# Patient Record
Sex: Female | Born: 1995
Health system: Southern US, Community
[De-identification: ages and names within clinical notes are randomized; demographics above are authoritative.]

## PROBLEM LIST (undated history)

## (undated) DIAGNOSIS — Z789 Other specified health status: Secondary | ICD-10-CM

## (undated) DIAGNOSIS — A749 Chlamydial infection, unspecified: Secondary | ICD-10-CM

## (undated) DIAGNOSIS — A549 Gonococcal infection, unspecified: Secondary | ICD-10-CM

## (undated) DIAGNOSIS — R109 Unspecified abdominal pain: Secondary | ICD-10-CM

## (undated) HISTORY — DX: Unspecified abdominal pain: R10.9

## (undated) HISTORY — PX: SKIN SURGERY: SHX2413

---
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
## 2017-02-26 ENCOUNTER — Encounter (HOSPITAL_COMMUNITY): Payer: Self-pay | Admitting: Emergency Medicine

## 2017-02-26 ENCOUNTER — Emergency Department (HOSPITAL_COMMUNITY)
Admission: EM | Admit: 2017-02-26 | Discharge: 2017-02-26 | Disposition: A | Discharge status: Home / Self Care | Payer: Medicaid Other | Attending: Emergency Medicine | Admitting: Emergency Medicine

## 2017-02-26 DIAGNOSIS — Z79899 Other long term (current) drug therapy: Secondary | ICD-10-CM | POA: Diagnosis not present

## 2017-02-26 DIAGNOSIS — J029 Acute pharyngitis, unspecified: Secondary | ICD-10-CM | POA: Diagnosis present

## 2017-02-26 DIAGNOSIS — Z7722 Contact with and (suspected) exposure to environmental tobacco smoke (acute) (chronic): Secondary | ICD-10-CM | POA: Insufficient documentation

## 2017-02-26 DIAGNOSIS — J050 Acute obstructive laryngitis [croup]: Secondary | ICD-10-CM | POA: Diagnosis not present

## 2017-02-26 DIAGNOSIS — Z91040 Latex allergy status: Secondary | ICD-10-CM | POA: Diagnosis not present

## 2017-02-26 DIAGNOSIS — J040 Acute laryngitis: Secondary | ICD-10-CM

## 2017-02-26 LAB — RAPID STREP SCREEN (MED CTR MEBANE ONLY): Streptococcus, Group A Screen (Direct): NEGATIVE

## 2017-02-26 MED ORDER — ACETAMINOPHEN 325 MG PO TABS
650.0000 mg | ORAL_TABLET | Freq: Once | ORAL | Status: AC | PRN
Start: 2017-02-26 — End: 2017-02-26
  Administered 2017-02-26: 650 mg via ORAL

## 2017-02-26 MED ORDER — ACETAMINOPHEN 325 MG PO TABS
ORAL_TABLET | ORAL | Status: AC
  Filled 2017-02-26: qty 2

## 2017-02-26 MED ORDER — LIDOCAINE VISCOUS 2 % MT SOLN: 15.0000 mL | OROMUCOSAL | 0 refills | Status: DC | PRN

## 2017-02-26 NOTE — Discharge Instructions (Signed)
Perform vocal rest and try not to talk over the next 2 days. Push fluids and drink plenty of water or Gatorade.  For pain control please take ibuprofen (also known as Motrin or Advil) 800mg  (this is normally 4 over the counter pills) 3 times a day  for 5 days. Take with food to minimize stomach irritation.  Please follow with your primary care doctor in the next 2 days for a check-up. They must obtain records for further management.   Do not hesitate to return to the Emergency Department for any new, worsening or concerning symptoms.

## 2017-02-26 NOTE — ED Triage Notes (Signed)
Pt presents with sore throat and left ear pain x 2 days; pt denies fever or trauma to ear that she is aware of; pt reports pain with swallowing and denies breathing trouble; pt brought bottle of cough suppressant DM and states she has 'been sipping on it all day"

## 2017-02-26 NOTE — ED Provider Notes (Signed)
[facility]-EMERGENCY DEPT Provider Note   CSN: [id number] Arrival date & time: 02/26/17  2138     History   Chief Complaint Chief Complaint  Patient presents with  . Sore Throat  . Otalgia    left     HPI   Blood pressure 133/66, pulse 98, temperature 99.4 F (37.4 C), temperature source Oral, resp. rate 20, height 5\' 6"  (1.676 m), weight 81.6 kg, SpO2 98 %.  Kristi Roth is a 21 y.o. female complaining of sore throat, hoarse voice, dry cough and any nose onset day before yesterday. She denies sick contacts, chest pain, shortness of breath, nausea or vomiting. Review of systems she endorses a decreased by mouth intake  Past Medical History:  Diagnosis Date  . Abdominal pain     Patient Active Problem List   Diagnosis Date Noted  . Abdominal pain     History reviewed. No pertinent surgical history.  OB History    No data available       Home Medications    Prior to Admission medications   Medication Sig Start Date End Date Taking? Authorizing Provider  cephALEXin (KEFLEX) 500 MG capsule Take 1 capsule (500 mg total) by mouth 3 (three) times daily. 08/21/15   Jennifer Piepenbrink, PA-C  ibuprofen (ADVIL,MOTRIN) 200 MG tablet Take 800 mg by mouth every 6 (six) hours as needed for pain.    Historical Provider, MD  lidocaine (XYLOCAINE) 2 % solution Use as directed 15 mLs in the mouth or throat every 3 (three) hours as needed. 02/26/17   Joni Reining Eliabeth Shoff, PA-C  norethindrone-ethinyl estradiol 1/35 (ORTHO-NOVUM, NORTREL,CYCLAFEM) tablet Take 1 tablet by mouth 2 (two) times daily. For 1 week then 1 time daily for 1 week. 08/10/15   Linna Hoff, MD  ondansetron (ZOFRAN ODT) 4 MG disintegrating tablet Take 1 tablet (4 mg total) by mouth every 8 (eight) hours as needed. 03/08/15   Viviano Simas, NP  phenazopyridine (PYRIDIUM) 200 MG tablet Take 1 tablet (200 mg total) by mouth 3 (three) times daily. 08/21/15   Jennifer Piepenbrink, PA-C  polyethylene glycol powder  (GLYCOLAX/MIRALAX) powder Take 17 g by mouth daily. 08/26/15   Earley Favor, NP    Family History History reviewed. No pertinent family history.  Social History Social History  Substance Use Topics  . Smoking status: Passive Smoke Exposure - Never Smoker  . Smokeless tobacco: Never Used  . Alcohol use No     Allergies   Latex   Review of Systems Review of Systems  10 systems reviewed and found to be negative, except as noted in the HPI.   Physical Exam Updated Vital Signs BP 133/66   Pulse 98   Temp 99.4 F (37.4 C) (Oral)   Resp 20   Ht 5\' 6"  (1.676 m)   Wt 81.6 kg   SpO2 98%   BMI 29.05 kg/m   Physical Exam  Constitutional: She appears well-developed and well-nourished.  Hoarse voice  HENT:  Head: Normocephalic.  Right Ear: External ear normal.  Left Ear: External ear normal.  Mouth/Throat: Oropharynx is clear and moist. No oropharyngeal exudate.  No drooling or stridor. Posterior pharynx mildly erythematous no significant tonsillar hypertrophy. No exudate. Soft palate rises symmetrically. No TTP or induration under tongue.   No tenderness to palpation of frontal or bilateral maxillary sinuses.  Mild mucosal edema in the nares with scant rhinorrhea.  Bilateral tympanic membranes with normal architecture and good light reflex.    Eyes: Conjunctivae and EOM are  normal. Pupils are equal, round, and reactive to light.  Neck: Normal range of motion. Neck supple.  Cardiovascular: Normal rate and regular rhythm.   Pulmonary/Chest: Effort normal and breath sounds normal. No stridor. No respiratory distress. She has no wheezes. She has no rales. She exhibits no tenderness.  Abdominal: Soft. There is no tenderness. There is no rebound and no guarding.  Nursing note and vitals reviewed.    ED Treatments / Results  Labs (all labs ordered are listed, but only abnormal results are displayed) Labs Reviewed  RAPID STREP SCREEN (NOT AT [facility])  CULTURE, GROUP A STREP  [facility])    EKG  EKG Interpretation None       Radiology No results found.  Procedures Procedures (including critical care time)  Medications Ordered in ED Medications  acetaminophen (TYLENOL) 325 MG tablet (not administered)  acetaminophen (TYLENOL) tablet 650 mg (650 mg Oral Given 02/26/17 2156)     Initial Impression / Assessment and Plan / ED Course  I have reviewed the triage vital signs and the nursing notes.  Pertinent labs & imaging results that were available during my care of the patient were reviewed by me and considered in my medical decision making (see chart for details).     Vitals:   02/26/17 2150  BP: 133/66  Pulse: 98  Resp: 20  Temp: 99.4 F (37.4 C)  TempSrc: Oral  SpO2: 98%  Weight: 81.6 kg  Height: 5\' 6"  (1.676 m)    Medications  acetaminophen (TYLENOL) 325 MG tablet (not administered)  acetaminophen (TYLENOL) tablet 650 mg (650 mg Oral Given 02/26/17 2156)    Kristi Roth is 21 y.o. female presenting with Sore throat, hoarse voice and URI symptoms. Vital signs reassuring, lung sounds clear to auscultation, rapid strep negative. Likely a viral lymphangitis. Patient advised vocal rest, push fluids.  Evaluation does not show pathology that would require ongoing emergent intervention or inpatient treatment. Pt is hemodynamically stable and mentating appropriately. Discussed findings and plan with patient/guardian, who agrees with care plan. All questions answered. Return precautions discussed and outpatient follow up given.      Final Clinical Impressions(s) / ED Diagnoses   Final diagnoses:  Laryngitis    New Prescriptions New Prescriptions   LIDOCAINE (XYLOCAINE) 2 % SOLUTION    Use as directed 15 mLs in the mouth or throat every 3 (three) hours as needed.     Wynetta Emeryicole Jhaden Pizzuto, PA-C 02/26/17 2245    Canary Brimhristopher J Tegeler, MD 02/26/17 [phone]

## 2017-03-01 LAB — CULTURE, GROUP A STREP (THRC)

## 2017-06-15 ENCOUNTER — Ambulatory Visit (HOSPITAL_COMMUNITY)
Admission: EM | Admit: 2017-06-15 | Discharge: 2017-06-15 | Disposition: A | Discharge status: Home / Self Care | Payer: Medicaid Other | Attending: Internal Medicine | Admitting: Internal Medicine

## 2017-06-15 ENCOUNTER — Encounter (HOSPITAL_COMMUNITY): Payer: Self-pay | Admitting: Emergency Medicine

## 2017-06-15 DIAGNOSIS — S90851A Superficial foreign body, right foot, initial encounter: Secondary | ICD-10-CM | POA: Diagnosis not present

## 2017-06-15 DIAGNOSIS — M79671 Pain in right foot: Secondary | ICD-10-CM | POA: Diagnosis not present

## 2017-06-15 DIAGNOSIS — M795 Residual foreign body in soft tissue: Secondary | ICD-10-CM | POA: Diagnosis not present

## 2017-06-15 DIAGNOSIS — Z23 Encounter for immunization: Secondary | ICD-10-CM | POA: Diagnosis not present

## 2017-06-15 MED ORDER — TETANUS-DIPHTH-ACELL PERTUSSIS 5-2.5-18.5 LF-MCG/0.5 IM SUSP
0.5000 mL | Freq: Once | INTRAMUSCULAR | Status: AC
  Administered 2017-06-15: 0.5 mL via INTRAMUSCULAR

## 2017-06-15 MED ORDER — LIDOCAINE-EPINEPHRINE (PF) 2 %-1:200000 IJ SOLN
INTRAMUSCULAR | Status: AC
  Filled 2017-06-15: qty 20

## 2017-06-15 MED ORDER — CEPHALEXIN 750 MG PO CAPS: 750.0000 mg | ORAL_CAPSULE | Freq: Two times a day (BID) | ORAL | 0 refills | Status: DC

## 2017-06-15 MED ORDER — TETANUS-DIPHTH-ACELL PERTUSSIS 5-2.5-18.5 LF-MCG/0.5 IM SUSP
INTRAMUSCULAR | Status: AC
  Filled 2017-06-15: qty 0.5

## 2017-06-15 NOTE — ED Provider Notes (Signed)
    06/15/2017 3:55 PM   DOB: Mar 15, 1996 / MRN: [id number]  SUBJECTIVE:  Kristi Roth is a 21 y.o. female presenting for "a piece of glass in her right foot." This started a few weeks ago and tells me the pain is getting worse.  This is about the left lateral plantar surface of the right foot.  She has not tried any medication. Denies any spots on her hands.  Tells me that she did step on a piece of glass from a broken window.   There is no immunization history for the selected administration types on file for this patient.  She is allergic to latex.   She  has a past medical history of Abdominal pain.    She  reports that she is a non-smoker but has been exposed to tobacco smoke. She has never used smokeless tobacco. She reports that she does not drink alcohol or use drugs. She  reports that she does not engage in sexual activity. The patient  has no past surgical history on file.  Her family history is not on file.  Review of Systems  Constitutional: Negative for chills and fever.  Respiratory: Negative for cough.   Genitourinary: Negative for dysuria.  Musculoskeletal: Negative for myalgias.  Skin: Positive for itching and rash.  Neurological: Negative for dizziness.    The problem list and medications were reviewed and updated by myself where necessary and exist elsewhere in the encounter.   OBJECTIVE:  BP 104/67 (BP Location: Left Arm)   Pulse 66   Temp 98.3 F (36.8 C) (Oral)   Resp 16   SpO2 99%     Physical Exam  Constitutional: She is active.  Cardiovascular: Normal rate.   Pulmonary/Chest: Effort normal. No tachypnea.  Musculoskeletal:       Feet:  Neurological: She is alert.  Skin: Skin is warm.   Risk and benefits discussed and verbal consent obtained. Anesthetic allergies reviewed. Patient anesthetized using 4 cc of 2% lidocaine with epi. A 1 cm incision was made using a number 15 blade and a 1 mm shard of glass was removed from the wound.  The was not  wound packed. The patient tolerated the procedure without difficulty.   A clean dressing was placed and wound care instructions were provided.    No results found for this or any previous visit (from the past 72 hour(s)).  No results found.  ASSESSMENT AND PLAN:  Foot pain, right: She did have a piece of glass in her foot.  This was easily removed.  TDAP here today.  Starting keflex given tenderness of skin.  OTC NSAID per AVS for pain. RTC here PRN.   Foreign body (FB) in soft tissue    The patient is advised to call or return to clinic if she does not see an improvement in symptoms, or to seek the care of the closest emergency department if she worsens with the above plan.   Kristi Roth, MHS, PA-C [facility] 06/15/2017 3:55 PM    Kristi Roth, Kristi Tupou L, PA-C 06/15/17 1556

## 2017-06-15 NOTE — Discharge Instructions (Signed)
Take Ibuprofen 600 mg every 6 hours as needed for pain. Complete course of antibiotics. Wash the wound with warm water and soap daily and apply a bandaid.  Come back if needed.

## 2017-06-15 NOTE — ED Triage Notes (Signed)
[facility] a complaint of a piece of glass possible stuck in her right foot x 3 weeks.

## 2017-07-19 ENCOUNTER — Emergency Department (HOSPITAL_COMMUNITY)
Admission: EM | Admit: 2017-07-19 | Discharge: 2017-07-20 | Disposition: A | Discharge status: Home / Self Care | Payer: Medicaid Other | Attending: Emergency Medicine | Admitting: Emergency Medicine

## 2017-07-19 ENCOUNTER — Emergency Department (HOSPITAL_COMMUNITY): Payer: Medicaid Other

## 2017-07-19 ENCOUNTER — Encounter (HOSPITAL_COMMUNITY): Payer: Self-pay | Admitting: *Deleted

## 2017-07-19 DIAGNOSIS — Z79899 Other long term (current) drug therapy: Secondary | ICD-10-CM | POA: Insufficient documentation

## 2017-07-19 DIAGNOSIS — Z91040 Latex allergy status: Secondary | ICD-10-CM | POA: Diagnosis not present

## 2017-07-19 DIAGNOSIS — R1011 Right upper quadrant pain: Secondary | ICD-10-CM

## 2017-07-19 DIAGNOSIS — Z7722 Contact with and (suspected) exposure to environmental tobacco smoke (acute) (chronic): Secondary | ICD-10-CM | POA: Insufficient documentation

## 2017-07-19 LAB — COMPREHENSIVE METABOLIC PANEL
ALT: 11 U/L — ABNORMAL LOW (ref 14–54)
AST: 16 U/L (ref 15–41)
Albumin: 3.7 g/dL (ref 3.5–5.0)
Alkaline Phosphatase: 67 U/L (ref 38–126)
Anion gap: 8 (ref 5–15)
BUN: 5 mg/dL — ABNORMAL LOW (ref 6–20)
CHLORIDE: 103 mmol/L (ref 101–111)
CO2: 23 mmol/L (ref 22–32)
Calcium: 8.8 mg/dL — ABNORMAL LOW (ref 8.9–10.3)
Creatinine, Ser: 0.7 mg/dL (ref 0.44–1.00)
Glucose, Bld: 114 mg/dL — ABNORMAL HIGH (ref 65–99)
POTASSIUM: 3.5 mmol/L (ref 3.5–5.1)
Sodium: 134 mmol/L — ABNORMAL LOW (ref 135–145)
TOTAL PROTEIN: 7 g/dL (ref 6.5–8.1)
Total Bilirubin: 0.4 mg/dL (ref 0.3–1.2)

## 2017-07-19 LAB — CBC
HCT: 35.8 % — ABNORMAL LOW (ref 36.0–46.0)
Hemoglobin: 11.7 g/dL — ABNORMAL LOW (ref 12.0–15.0)
MCH: 27.3 pg (ref 26.0–34.0)
MCHC: 32.7 g/dL (ref 30.0–36.0)
MCV: 83.6 fL (ref 78.0–100.0)
Platelets: 302 10*3/uL (ref 150–400)
RBC: 4.28 MIL/uL (ref 3.87–5.11)
RDW: 13.1 % (ref 11.5–15.5)
WBC: 9.8 10*3/uL (ref 4.0–10.5)

## 2017-07-19 LAB — LIPASE, BLOOD: LIPASE: 26 U/L (ref 11–51)

## 2017-07-19 LAB — I-STAT BETA HCG BLOOD, ED (MC, WL, AP ONLY)

## 2017-07-19 MED ORDER — ONDANSETRON HCL 4 MG/2ML IJ SOLN
4.0000 mg | Freq: Once | INTRAMUSCULAR | Status: AC
  Administered 2017-07-20: 4 mg via INTRAVENOUS
  Filled 2017-07-19: qty 2

## 2017-07-19 MED ORDER — MORPHINE SULFATE (PF) 4 MG/ML IV SOLN
4.0000 mg | Freq: Once | INTRAVENOUS | Status: AC
  Administered 2017-07-20: 4 mg via INTRAVENOUS
  Filled 2017-07-19: qty 1

## 2017-07-19 NOTE — ED Triage Notes (Signed)
The pt is c/o rt upper abd pain for 2 years intermittently.  The pain is worse for the past few days.  She has been told in the past she has gallstones  lmp now

## 2017-07-19 NOTE — ED Notes (Signed)
Patient transported to Ultrasound 

## 2017-07-20 MED ORDER — SUCRALFATE 1 GM/10ML PO SUSP: 1.0000 g | Freq: Three times a day (TID) | ORAL | 0 refills | Status: DC

## 2017-07-20 MED ORDER — HYDROCODONE-ACETAMINOPHEN 5-325 MG PO TABS: 1.0000 | ORAL_TABLET | Freq: Four times a day (QID) | ORAL | 0 refills | Status: DC | PRN

## 2017-07-20 MED ORDER — OMEPRAZOLE 20 MG PO CPDR: 20.0000 mg | DELAYED_RELEASE_CAPSULE | Freq: Every day | ORAL | 0 refills | Status: DC

## 2017-07-20 NOTE — ED Provider Notes (Signed)
[facility] Provider Note   CSN: [id number] Arrival date & time: 07/19/17  2057     History   Chief Complaint Chief Complaint  Patient presents with  . Abdominal Pain    HPI Kristi Roth is a 21 y.o. female.  Patient presents to the emergency department with chief complaint of right upper quadrant abdominal pain. She states that the pain has been intermittent for the past 2 years, but worsened over the past couple of days. She reports prior history of gallstones. She denies any fevers, chills, nausea, vomiting. She denies any association with eating or drinking. She denies any lower abdominal pain. She has not taken anything for her symptoms. There are no other associated symptoms or modifying factors.   The history is provided by the patient. No language interpreter was used.    Past Medical History:  Diagnosis Date  . Abdominal pain     Patient Active Problem List   Diagnosis Date Noted  . Abdominal pain     History reviewed. No pertinent surgical history.  OB History    No data available       Home Medications    Prior to Admission medications   Medication Sig Start Date End Date Taking? Authorizing Provider  cephALEXin (KEFLEX) 750 MG capsule Take 1 capsule (750 mg total) by mouth 2 (two) times daily. Take with food. 06/15/17   Ofilia Neaslark, Michael L, PA-C  HYDROcodone-acetaminophen (NORCO/VICODIN) 5-325 MG tablet Take 1 tablet by mouth every 6 (six) hours as needed. 07/20/17   Roxy HorsemanBrowning, Beulah Capobianco, PA-C  omeprazole (PRILOSEC) 20 MG capsule Take 1 capsule (20 mg total) by mouth daily. 07/20/17   Roxy HorsemanBrowning, Huntleigh Doolen, PA-C  sucralfate (CARAFATE) 1 GM/10ML suspension Take 10 mLs (1 g total) by mouth 4 (four) times daily -  with meals and at bedtime. 07/20/17   Roxy HorsemanBrowning, Lizbeth Feijoo, PA-C    Family History No family history on file.  Social History Social History  Substance Use Topics  . Smoking status: Passive Smoke Exposure - Never Smoker  . Smokeless tobacco: Never  Used  . Alcohol use No     Allergies   Latex   Review of Systems Review of Systems  All other systems reviewed and are negative.    Physical Exam Updated Vital Signs BP 121/62   Pulse 96   Temp 99.1 F (37.3 C) (Oral)   Resp 20   Ht 5\' 6"  (1.676 m)   Wt 82.1 kg (181 lb)   SpO2 100%   BMI 29.21 kg/m   Physical Exam  Constitutional: She is oriented to person, place, and time. She appears well-developed and well-nourished.  HENT:  Head: Normocephalic and atraumatic.  Eyes: Pupils are equal, round, and reactive to light. Conjunctivae and EOM are normal.  Neck: Normal range of motion. Neck supple.  Cardiovascular: Normal rate and regular rhythm.  Exam reveals no gallop and no friction rub.   No murmur heard. Pulmonary/Chest: Effort normal and breath sounds normal. No respiratory distress. She has no wheezes. She has no rales. She exhibits no tenderness.  Abdominal: Soft. Bowel sounds are normal. She exhibits no distension and no mass. There is tenderness. There is no rebound and no guarding.  Right upper quadrant tenderness palpation  Musculoskeletal: Normal range of motion. She exhibits no edema or tenderness.  Neurological: She is alert and oriented to person, place, and time.  Skin: Skin is warm and dry.  Psychiatric: She has a normal mood and affect. Her behavior is normal. Judgment  and thought content normal.  Nursing note and vitals reviewed.    ED Treatments / Results  Labs (all labs ordered are listed, but only abnormal results are displayed) Labs Reviewed  COMPREHENSIVE METABOLIC PANEL - Abnormal; Notable for the following:       Result Value   Sodium 134 (*)    Glucose, Bld 114 (*)    BUN 5 (*)    Calcium 8.8 (*)    ALT 11 (*)    All other components within normal limits  CBC - Abnormal; Notable for the following:    Hemoglobin 11.7 (*)    HCT 35.8 (*)    All other components within normal limits  LIPASE, BLOOD  I-STAT BETA HCG BLOOD, ED ([facility], [facility],  [facility] ONLY)    EKG  EKG Interpretation None       Radiology US Abdomen Limited  Result Date: 07/20/2017 CLINICAL DATA:  Right upper quadrant pain EXAM: ULTRASOUND ABDOMEN LIMITED RIGHT UPPER QUADRANT COMPARISON:  Abdominal ultrasound 04/26/2013 FINDINGS: Gallbladder: No gallstones or wall thickening visualized. No sonographic Murphy sign noted by sonographer. Common bile duct: Diameter: 2.2 mm Liver: No focal lesion identified. Within normal limits in parenchymal echogenicity. IMPRESSION: Normal right upper quadrant ultrasound. Electronically Signed   By: Deatra Robinson M.D.   On: 07/20/2017 00:10    Procedures Procedures (including critical care time)  Medications Ordered in ED Medications  morphine 4 MG/ML injection 4 mg (4 mg Intravenous Given 07/20/17 0016)  ondansetron (ZOFRAN) injection 4 mg (4 mg Intravenous Given 07/20/17 0016)     Initial Impression / Assessment and Plan / ED Course  I have reviewed the triage vital signs and the nursing notes.  Pertinent labs & imaging results that were available during my care of the patient were reviewed by me and considered in my medical decision making (see chart for details).     Patient with right upper quadrant abdominal pain 2 days. Symptoms have been intermittent for 2 years. Known history of gallstones. Patient has no gallstones on right upper quadrant ultrasound today. No evidence of cholecystitis. Laboratory workup is reassuring. Normal LFTs, normal lipase. Patient is afebrile. Vital signs are stable. Recommend return precautions and follow-up with outpatient PCP and possibly gastroenterology. Will also try Carafate and omeprazole for possible ulcer.  Final Clinical Impressions(s) / ED Diagnoses   Final diagnoses:  Right upper quadrant abdominal pain    New Prescriptions New Prescriptions   HYDROCODONE-ACETAMINOPHEN (NORCO/VICODIN) 5-325 MG TABLET    Take 1 tablet by mouth every 6 (six) hours as needed.   OMEPRAZOLE  (PRILOSEC) 20 MG CAPSULE    Take 1 capsule (20 mg total) by mouth daily.   SUCRALFATE (CARAFATE) 1 GM/10ML SUSPENSION    Take 10 mLs (1 g total) by mouth 4 (four) times daily -  with meals and at bedtime.     Roxy Horseman, PA-C 07/20/17 1610    Blane Ohara, MD 07/20/17 [phone]

## 2017-10-25 ENCOUNTER — Ambulatory Visit (HOSPITAL_COMMUNITY)
Admission: EM | Admit: 2017-10-25 | Discharge: 2017-10-25 | Disposition: A | Discharge status: Home / Self Care | Payer: Medicaid Other | Attending: Family Medicine | Admitting: Family Medicine

## 2017-10-25 ENCOUNTER — Encounter (HOSPITAL_COMMUNITY): Payer: Self-pay | Admitting: Family Medicine

## 2017-10-25 DIAGNOSIS — W503XXA Accidental bite by another person, initial encounter: Secondary | ICD-10-CM

## 2017-10-25 DIAGNOSIS — Z23 Encounter for immunization: Secondary | ICD-10-CM | POA: Diagnosis not present

## 2017-10-25 DIAGNOSIS — S0081XA Abrasion of other part of head, initial encounter: Secondary | ICD-10-CM | POA: Diagnosis not present

## 2017-10-25 MED ORDER — MUPIROCIN 2 % EX OINT: 1.0000 "application " | TOPICAL_OINTMENT | Freq: Three times a day (TID) | CUTANEOUS | 1 refills | Status: DC

## 2017-10-25 MED ORDER — TETANUS-DIPHTH-ACELL PERTUSSIS 5-2.5-18.5 LF-MCG/0.5 IM SUSP
INTRAMUSCULAR | Status: AC
  Filled 2017-10-25: qty 0.5

## 2017-10-25 MED ORDER — TETANUS-DIPHTH-ACELL PERTUSSIS 5-2.5-18.5 LF-MCG/0.5 IM SUSP
0.5000 mL | Freq: Once | INTRAMUSCULAR | Status: AC
  Administered 2017-10-25: 0.5 mL via INTRAMUSCULAR
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
## 2017-10-25 NOTE — Discharge Instructions (Signed)
Gently wash the left cheek with soap and water three times a day and then apply the antibiotic cream thinly to the area.

## 2018-02-27 DIAGNOSIS — A549 Gonococcal infection, unspecified: Secondary | ICD-10-CM

## 2018-02-27 HISTORY — DX: Gonococcal infection, unspecified: A54.9

## 2018-03-04 ENCOUNTER — Other Ambulatory Visit: Payer: Self-pay

## 2018-03-04 ENCOUNTER — Emergency Department (HOSPITAL_COMMUNITY)
Admission: EM | Admit: 2018-03-04 | Discharge: 2018-03-04 | Disposition: A | Discharge status: Home / Self Care | Payer: Medicaid Other | Attending: Emergency Medicine | Admitting: Emergency Medicine

## 2018-03-04 ENCOUNTER — Encounter (HOSPITAL_COMMUNITY): Payer: Self-pay

## 2018-03-04 DIAGNOSIS — Z7722 Contact with and (suspected) exposure to environmental tobacco smoke (acute) (chronic): Secondary | ICD-10-CM | POA: Diagnosis not present

## 2018-03-04 DIAGNOSIS — N730 Acute parametritis and pelvic cellulitis: Secondary | ICD-10-CM

## 2018-03-04 DIAGNOSIS — R102 Pelvic and perineal pain: Secondary | ICD-10-CM | POA: Diagnosis present

## 2018-03-04 DIAGNOSIS — N739 Female pelvic inflammatory disease, unspecified: Secondary | ICD-10-CM | POA: Insufficient documentation

## 2018-03-04 LAB — COMPREHENSIVE METABOLIC PANEL
ALT: 13 U/L — ABNORMAL LOW (ref 14–54)
ANION GAP: 11 (ref 5–15)
AST: 19 U/L (ref 15–41)
Albumin: 4.1 g/dL (ref 3.5–5.0)
Alkaline Phosphatase: 65 U/L (ref 38–126)
BUN: <5 mg/dL — ABNORMAL LOW (ref 6–20)
CHLORIDE: 106 mmol/L (ref 101–111)
CO2: 24 mmol/L (ref 22–32)
Calcium: 9.3 mg/dL (ref 8.9–10.3)
Creatinine, Ser: 0.87 mg/dL (ref 0.44–1.00)
Glucose, Bld: 98 mg/dL (ref 65–99)
Potassium: 3.5 mmol/L (ref 3.5–5.1)
SODIUM: 141 mmol/L (ref 135–145)
Total Bilirubin: 1 mg/dL (ref 0.3–1.2)
Total Protein: 7.3 g/dL (ref 6.5–8.1)

## 2018-03-04 LAB — URINALYSIS, ROUTINE W REFLEX MICROSCOPIC
BILIRUBIN URINE: NEGATIVE
GLUCOSE, UA: NEGATIVE mg/dL
KETONES UR: 20 mg/dL — AB
Nitrite: NEGATIVE
PH: 6 (ref 5.0–8.0)
PROTEIN: 30 mg/dL — AB
Specific Gravity, Urine: 1.028 (ref 1.005–1.030)

## 2018-03-04 LAB — I-STAT BETA HCG BLOOD, ED (MC, WL, AP ONLY): I-stat hCG, quantitative: <5 m[IU]/mL (ref <5)

## 2018-03-04 LAB — CBC
HCT: 39.9 % (ref 36.0–46.0)
HEMOGLOBIN: 13.3 g/dL (ref 12.0–15.0)
MCH: 29.2 pg (ref 26.0–34.0)
MCHC: 33.3 g/dL (ref 30.0–36.0)
MCV: 87.5 fL (ref 78.0–100.0)
Platelets: 319 10*3/uL (ref 150–400)
RBC: 4.56 MIL/uL (ref 3.87–5.11)
RDW: 13.4 % (ref 11.5–15.5)
WBC: 8.2 10*3/uL (ref 4.0–10.5)

## 2018-03-04 LAB — WET PREP, GENITAL
Clue Cells Wet Prep HPF POC: NONE SEEN
SPERM: NONE SEEN
TRICH WET PREP: NONE SEEN
YEAST WET PREP: NONE SEEN

## 2018-03-04 LAB — LIPASE, BLOOD: LIPASE: 27 U/L (ref 11–51)

## 2018-03-04 MED ORDER — CEFTRIAXONE SODIUM 250 MG IJ SOLR
250.0000 mg | Freq: Once | INTRAMUSCULAR | Status: AC
  Administered 2018-03-04: 250 mg via INTRAMUSCULAR
  Filled 2018-03-04: qty 250

## 2018-03-04 MED ORDER — DOXYCYCLINE HYCLATE 100 MG PO CAPS: 100.0000 mg | ORAL_CAPSULE | Freq: Two times a day (BID) | ORAL | 0 refills | Status: AC

## 2018-03-04 MED ORDER — ONDANSETRON 4 MG PO TBDP
4.0000 mg | ORAL_TABLET | Freq: Once | ORAL | Status: AC
  Administered 2018-03-04: 4 mg via ORAL
  Filled 2018-03-04: qty 1

## 2018-03-04 MED ORDER — KETOROLAC TROMETHAMINE 60 MG/2ML IM SOLN
30.0000 mg | Freq: Once | INTRAMUSCULAR | Status: AC
  Administered 2018-03-04: 30 mg via INTRAMUSCULAR
  Filled 2018-03-04: qty 2

## 2018-03-04 MED ORDER — DOXYCYCLINE HYCLATE 100 MG PO TABS
100.0000 mg | ORAL_TABLET | Freq: Once | ORAL | Status: AC
  Administered 2018-03-04: 100 mg via ORAL
  Filled 2018-03-04: qty 1

## 2018-03-04 NOTE — ED Notes (Signed)
D/c reviewed with patient. No further questions at this time 

## 2018-03-04 NOTE — ED Notes (Signed)
Patient reports that she has had nausea, vomiting and diarrhea X 2 days.

## 2018-03-04 NOTE — ED Provider Notes (Signed)
[facility] EMERGENCY DEPARTMENT Provider Note  CSN: [id number] Arrival date & time: 03/04/18 7829  Chief Complaint(s) No chief complaint on file.  HPI Kristi Roth is a 22 y.o. female with no prior past medical history presents to the emergency department with several days of constant aching pelvic pain that fluctuates and exacerbated with movement and palpation of the lower suprapubic region.  Patient reports last menstrual period was approximately 6 days ago and is now spotting.  Denies any vaginal discharge.  Denies any dysuria or flank pain.  Endorses nausea without emesis.  No diarrhea.  No fevers or chills.  Patient is sexually active but denies any prior sexually transmitted disease. STIs.   HPI  Past Medical History Past Medical History:  Diagnosis Date  . Abdominal pain    Patient Active Problem List   Diagnosis Date Noted  . Abdominal pain    Home Medication(s) Prior to Admission medications   Medication Sig Start Date End Date Taking? Authorizing Provider  Acetaminophen-Caff-Pyrilamine (MIDOL COMPLETE) 500-60-15 MG TABS Take 1 tablet by mouth daily as needed (pain).   Yes [provider]  cephALEXin (KEFLEX) 750 MG capsule Take 1 capsule (750 mg total) by mouth 2 (two) times daily. Take with food. Patient not taking: Reported on 03/04/2018 06/15/17   Ofilia Neas, PA-C  doxycycline (VIBRAMYCIN) 100 MG capsule Take 1 capsule (100 mg total) by mouth 2 (two) times daily for 14 days. 03/04/18 03/18/18  Nira Conn, MD  HYDROcodone-acetaminophen (NORCO/VICODIN) 5-325 MG tablet Take 1 tablet by mouth every 6 (six) hours as needed. Patient not taking: Reported on 03/04/2018 07/20/17   Roxy Horseman, PA-C  mupirocin ointment (BACTROBAN) 2 % Apply 1 application topically 3 (three) times daily. Patient not taking: Reported on 03/04/2018 10/25/17   Elvina Sidle, MD  omeprazole (PRILOSEC) 20 MG capsule Take 1 capsule (20 mg total) by mouth  daily. Patient not taking: Reported on 03/04/2018 07/20/17   Roxy Horseman, PA-C  sucralfate (CARAFATE) 1 GM/10ML suspension Take 10 mLs (1 g total) by mouth 4 (four) times daily -  with meals and at bedtime. Patient not taking: Reported on 03/04/2018 07/20/17   Roxy Horseman, PA-C                                                                                                                                    Past Surgical History History reviewed. No pertinent surgical history. Family History No family history on file.  Social History Social History   Tobacco Use  . Smoking status: Passive Smoke Exposure - Never Smoker  . Smokeless tobacco: Never Used  Substance Use Topics  . Alcohol use: No  . Drug use: No   Allergies Latex  Review of Systems Review of Systems All other systems are reviewed and are negative for acute change except as noted in the HPI  Physical Exam Vital Signs  I have reviewed  the triage vital signs BP 104/64   Pulse (!) 120   Temp 98.3 F (36.8 C) (Oral)   Resp 18   SpO2 100%   Physical Exam  Constitutional: She is oriented to person, place, and time. She appears well-developed and well-nourished. No distress.  HENT:  Head: Normocephalic and atraumatic.  Right Ear: External ear normal.  Left Ear: External ear normal.  Nose: Nose normal.  Eyes: Conjunctivae and EOM are normal. No scleral icterus.  Neck: Normal range of motion and phonation normal.  Cardiovascular: Normal rate and regular rhythm.  Pulmonary/Chest: Effort normal. No stridor. No respiratory distress.  Abdominal: She exhibits no distension. There is tenderness in the suprapubic area and left lower quadrant. There is no rigidity, no rebound, no guarding and no CVA tenderness.  Genitourinary: Pelvic exam was performed with patient supine. Uterus is tender. Cervix exhibits motion tenderness, discharge and friability. Left adnexum displays tenderness. Vaginal discharge found.    Genitourinary Comments: Chaperone present during pelvic exam.  Musculoskeletal: Normal range of motion. She exhibits no edema.  Neurological: She is alert and oriented to person, place, and time.  Skin: She is not diaphoretic.  Psychiatric: She has a normal mood and affect. Her behavior is normal.  Vitals reviewed.   ED Results and Treatments Labs (all labs ordered are listed, but only abnormal results are displayed) Labs Reviewed  WET PREP, GENITAL - Abnormal; Notable for the following components:      Result Value   WBC, Wet Prep HPF POC MANY (*)    All other components within normal limits  COMPREHENSIVE METABOLIC PANEL - Abnormal; Notable for the following components:   BUN <5 (*)    ALT 13 (*)    All other components within normal limits  URINALYSIS, ROUTINE W REFLEX MICROSCOPIC - Abnormal; Notable for the following components:   APPearance HAZY (*)    Hgb urine dipstick MODERATE (*)    Ketones, ur 20 (*)    Protein, ur 30 (*)    Leukocytes, UA LARGE (*)    Bacteria, UA FEW (*)    Squamous Epithelial / LPF 0-5 (*)    All other components within normal limits  LIPASE, BLOOD  CBC  I-STAT BETA HCG BLOOD, ED ([facility], [facility], [facility] ONLY)  GC/CHLAMYDIA PROBE AMP ([place]) NOT AT [facility]                                                                                                                         EKG  EKG Interpretation  Date/Time:    Ventricular Rate:    PR Interval:    QRS Duration:   QT Interval:    QTC Calculation:   R Axis:     Text Interpretation:        Radiology No results found. Pertinent labs & imaging results that were available during my care of the patient were reviewed by me and considered in my medical decision making (see chart for details).  Medications  Ordered in ED Medications  ondansetron (ZOFRAN-ODT) disintegrating tablet 4 mg (4 mg Oral Given 03/04/18 1033)  ketorolac (TORADOL) injection 30 mg (30 mg Intramuscular Given 03/04/18 1311)   cefTRIAXone (ROCEPHIN) injection 250 mg (250 mg Intramuscular Given 03/04/18 1311)  doxycycline (VIBRA-TABS) tablet 100 mg (100 mg Oral Given 03/04/18 1311)                                                                                                                                    Procedures Procedures  (including critical care time)  Medical Decision Making / ED Course I have reviewed the nursing notes for this encounter and the patient's prior records (if available in EHR or on provided paperwork).  Clinical Course as of Mar 04 2110  Wed Mar 04, 2018  1242 HCG negative. Pelvic exam concerning for PID.  Will treat empirically with IM Rocephin and doxycycline.  [PC]  1242 All the labs grossly reassuring without leukocytosis, significant electrolyte derangements, renal insufficiency, biliary obstruction or evidence of pancreatitis.  UA with hematuria but not suspicious for urinary tract infection. Low suspicion for TOA or torsion.  [PC]    Clinical Course User Index [PC] Alijah Hyde, Amadeo GarnetPedro Eduardo, MD     Final Clinical Impression(s) / ED Diagnoses Final diagnoses:  PID (acute pelvic inflammatory disease)   Disposition: Discharge  Condition: Good  I have discussed the results, Dx and Tx plan with the patient who expressed understanding and agree(s) with the plan. Discharge instructions discussed at great length. The patient was given strict return precautions who verbalized understanding of the instructions. No further questions at time of discharge.    ED Discharge Orders        Ordered    doxycycline (VIBRAMYCIN) 100 MG capsule  2 times daily     03/04/18 1411       Follow Up: Inc, [facility] [address] [place] [address] [phone]  Schedule an appointment as soon as possible for a visit in 2 weeks To ensure adequate treatment      This chart was dictated using voice recognition software.  Despite best efforts to proofread,   errors can occur which can change the documentation meaning.   Nira Connardama, Twylia Oka Eduardo, MD 03/04/18 2111

## 2018-03-04 NOTE — ED Triage Notes (Signed)
patient complains of abdominal pain with nausea and lower back pain and generalized headache x 2 days. Denies vomiting, denies dysuria

## 2018-03-05 LAB — GC/CHLAMYDIA PROBE AMP (~~LOC~~) NOT AT ARMC
CHLAMYDIA, DNA PROBE: NEGATIVE
NEISSERIA GONORRHEA: POSITIVE — AB

## 2018-06-21 ENCOUNTER — Encounter (HOSPITAL_COMMUNITY): Payer: Self-pay | Admitting: Emergency Medicine

## 2018-06-21 ENCOUNTER — Other Ambulatory Visit: Payer: Self-pay

## 2018-06-21 ENCOUNTER — Ambulatory Visit (HOSPITAL_COMMUNITY)
Admission: EM | Admit: 2018-06-21 | Discharge: 2018-06-21 | Disposition: A | Discharge status: Home / Self Care | Payer: Medicaid Other | Attending: Family Medicine | Admitting: Family Medicine

## 2018-06-21 DIAGNOSIS — M542 Cervicalgia: Secondary | ICD-10-CM

## 2018-06-21 DIAGNOSIS — R0789 Other chest pain: Secondary | ICD-10-CM

## 2018-06-21 DIAGNOSIS — M5489 Other dorsalgia: Secondary | ICD-10-CM | POA: Diagnosis not present

## 2018-06-21 DIAGNOSIS — M791 Myalgia, unspecified site: Secondary | ICD-10-CM

## 2018-06-21 MED ORDER — CYCLOBENZAPRINE HCL 10 MG PO TABS
10.0000 mg | ORAL_TABLET | Freq: Two times a day (BID) | ORAL | 0 refills | Status: AC | PRN
Start: 2018-06-21 — End: 2018-07-01
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
## 2019-01-26 ENCOUNTER — Emergency Department (HOSPITAL_COMMUNITY)
Admission: EM | Admit: 2019-01-26 | Discharge: 2019-01-26 | Discharge status: Against Medical Advice | Payer: Medicaid Other

## 2019-03-02 ENCOUNTER — Ambulatory Visit
Admission: EM | Admit: 2019-03-02 | Discharge: 2019-03-02 | Disposition: A | Discharge status: Home / Self Care | Payer: Medicaid Other | Attending: Physician Assistant | Admitting: Physician Assistant

## 2019-03-02 DIAGNOSIS — R197 Diarrhea, unspecified: Secondary | ICD-10-CM

## 2019-03-02 DIAGNOSIS — N898 Other specified noninflammatory disorders of vagina: Secondary | ICD-10-CM | POA: Diagnosis present

## 2019-03-02 DIAGNOSIS — R112 Nausea with vomiting, unspecified: Secondary | ICD-10-CM | POA: Diagnosis not present

## 2019-03-02 MED ORDER — ONDANSETRON 4 MG PO TBDP: 4.0000 mg | ORAL_TABLET | Freq: Three times a day (TID) | ORAL | 0 refills | Status: DC | PRN

## 2019-03-02 NOTE — ED Provider Notes (Signed)
[name]    CSN: [id number] Arrival date & time: 03/02/19  1646     History   Chief Complaint Chief Complaint  Patient presents with  . Nausea    HPI Kristi Roth is a 23 y.o. female.   23 year old female comes in for 4-day history of nausea, vomiting, diarrhea.  States had 4 episodes of nonbilious nonbloody vomit, last episode of vomiting 2 days ago.  Has had 5 episodes of diarrhea, last episode last night.  States started noticing vaginal discharge with odor this morning and came in for evaluation.  She tried to eat eggs, sausage this morning, and had low abdominal pain after eating, but has since resolved.  She has had mild nausea without vomiting.  Denies fever, chills, night sweats.  Denies URI symptoms such as cough, congestion, sore throat.  Denies urinary symptoms such as frequency, dysuria, hematuria.  She is sexually active with one female partner, no condom use.  LMP 02/12/2019.     Past Medical History:  Diagnosis Date  . Abdominal pain     Patient Active Problem List   Diagnosis Date Noted  . Abdominal pain     History reviewed. No pertinent surgical history.  OB History   No obstetric history on file.      Home Medications    Prior to Admission medications   Medication Sig Start Date End Date Taking? Authorizing Provider  ondansetron (ZOFRAN ODT) 4 MG disintegrating tablet Take 1 tablet (4 mg total) by mouth every 8 (eight) hours as needed for nausea or vomiting. 03/02/19   Belinda Fisher, PA-C    Family History No family history on file.  Social History Social History   Tobacco Use  . Smoking status: Passive Smoke Exposure - Never Smoker  . Smokeless tobacco: Never Used  Substance Use Topics  . Alcohol use: No  . Drug use: No     Allergies   Latex   Review of Systems Review of Systems  Reason unable to perform ROS: See HPI as above.     Physical Exam Triage Vital Signs ED Triage Vitals  Enc Vitals Group     BP 03/02/19  1701 (!) 156/53     Pulse Rate 03/02/19 1701 90     Resp --      Temp 03/02/19 1701 98.3 F (36.8 C)     Temp Source 03/02/19 1701 Oral     SpO2 03/02/19 1701 98 %     Weight --      Height --      Head Circumference --      Peak Flow --      Pain Score 03/02/19 1702 0     Pain Loc --      Pain Edu? --      Excl. in GC? --    No data found.  Updated Vital Signs BP (!) 156/53 (BP Location: Left Arm)   Pulse 90   Temp 98.3 F (36.8 C) (Oral)   LMP 02/12/2019   SpO2 98%   Physical Exam Constitutional:      General: She is not in acute distress.    Appearance: She is well-developed.  HENT:     Head: Normocephalic and atraumatic.  Eyes:     Conjunctiva/sclera: Conjunctivae normal.     Pupils: Pupils are equal, round, and reactive to light.  Cardiovascular:     Rate and Rhythm: Normal rate and regular rhythm.     Heart sounds: Normal  heart sounds. No murmur. No friction rub. No gallop.   Pulmonary:     Effort: Pulmonary effort is normal.     Breath sounds: Normal breath sounds. No wheezing or rales.  Abdominal:     General: Bowel sounds are normal.     Palpations: Abdomen is soft.     Tenderness: There is no abdominal tenderness. There is no guarding or rebound.  Skin:    General: Skin is warm and dry.  Neurological:     Mental Status: She is alert and oriented to person, place, and time.  Psychiatric:        Behavior: Behavior normal.        Judgment: Judgment normal.      UC Treatments / Results  Labs (all labs ordered are listed, but only abnormal results are displayed) Labs Reviewed  CERVICOVAGINAL ANCILLARY ONLY    EKG None  Radiology No results found.  Procedures Procedures (including critical care time)  Medications Ordered in UC Medications - No data to display  Initial Impression / Assessment and Plan / UC Course  I have reviewed the triage vital signs and the nursing notes.  Pertinent labs & imaging results that were available during  my care of the patient were reviewed by me and considered in my medical decision making (see chart for details).    Patient abdomen nontender to palpation.  Will avoid empiric treatment for vaginal discharge given recent symptoms of nausea, vomiting, diarrhea.  Will provide Zofran as needed.  Bland diet, advance as tolerated.  Cytology sent, patient will be contacted with any positive results.  Push fluids.  Return precautions given.  Patient expresses understanding and agrees to plan.  Final Clinical Impressions(s) / UC Diagnoses   Final diagnoses:  Nausea vomiting and diarrhea  Vaginal discharge    ED Prescriptions    Medication Sig Dispense Auth. Provider   ondansetron (ZOFRAN ODT) 4 MG disintegrating tablet Take 1 tablet (4 mg total) by mouth every 8 (eight) hours as needed for nausea or vomiting. 10 tablet Threasa Alpha, PA-C 03/02/19 1725

## 2019-03-02 NOTE — ED Triage Notes (Signed)
Pt states had n/v/d since Saturday, now nausea and having a vaginal discharge since this am with odor

## 2019-03-02 NOTE — Discharge Instructions (Signed)
Zofran for nausea and vomiting as needed. Keep hydrated, you urine should be clear to pale yellow in color. Bland diet, advance as tolerated. Cytology sent, you will be contacted with any positive results that requires further treatment. Refrain from sexual activity for the next 7 days. Monitor for any worsening of symptoms, fever, abdominal pain, nausea, vomiting, to follow up for reevaluation.

## 2019-03-04 ENCOUNTER — Ambulatory Visit
Admission: EM | Admit: 2019-03-04 | Discharge: 2019-03-04 | Disposition: A | Discharge status: Home / Self Care | Payer: Medicaid Other | Attending: Family Medicine | Admitting: Family Medicine

## 2019-03-04 ENCOUNTER — Encounter: Payer: Self-pay | Admitting: Emergency Medicine

## 2019-03-04 DIAGNOSIS — S61210A Laceration without foreign body of right index finger without damage to nail, initial encounter: Secondary | ICD-10-CM | POA: Diagnosis not present

## 2019-03-04 LAB — CERVICOVAGINAL ANCILLARY ONLY
Bacterial vaginitis: POSITIVE — AB
Candida vaginitis: POSITIVE — AB
Chlamydia: NEGATIVE
Neisseria Gonorrhea: NEGATIVE
Trichomonas: NEGATIVE

## 2019-03-04 NOTE — ED Notes (Signed)
Patient able to ambulate independently  

## 2019-03-04 NOTE — ED Triage Notes (Signed)
Pt presents to [facility] for assessment of laceration to pointer finger.  Occurred this morning approx 2am.  Bleeding controlled, but pt states it's still oozing.

## 2019-03-04 NOTE — Discharge Instructions (Addendum)
Steri strips applied Keep dry for the next 24 hours Wash gently with wash water and mild soap DO NOT submerge your hand in water Return or follow up with PCP if symptoms persists or worsen Return or go to the ED if you have any new or worsening symptoms such as fever, chills, redness, swelling, discharge, persistent bleeding, etc..Marland Kitchen

## 2019-03-04 NOTE — ED Provider Notes (Signed)
[facility]   [id number] 03/04/19 Arrival Time: 1520  CC: LACERATION  SUBJECTIVE:  Kristi Roth is a 23 y.o. female who presents with a laceration to right pointer finger that occurred this morning after cutting her finger on broken glass.  Has been wearing a Band-Aid.  Denies swelling, redness, active bleeding.    Td UTD: Yes.  ROS: As per HPI.  Past Medical History:  Diagnosis Date  . Abdominal pain    History reviewed. No pertinent surgical history. Allergies  Allergen Reactions  . Latex Dermatitis   No current facility-administered medications on file prior to encounter.    No current outpatient medications on file prior to encounter.   Social History   Socioeconomic History  . Marital status: Single    Spouse name: Not on file  . Number of children: Not on file  . Years of education: Not on file  . Highest education level: Not on file  Occupational History  . Not on file  Social Needs  . Financial resource strain: Not on file  . Food insecurity:    Worry: Not on file    Inability: Not on file  . Transportation needs:    Medical: Not on file    Non-medical: Not on file  Tobacco Use  . Smoking status: Passive Smoke Exposure - Never Smoker  . Smokeless tobacco: Never Used  Substance and Sexual Activity  . Alcohol use: No  . Drug use: No  . Sexual activity: Never    Comment: denies  Lifestyle  . Physical activity:    Days per week: Not on file    Minutes per session: Not on file  . Stress: Not on file  Relationships  . Social connections:    Talks on phone: Not on file    Gets together: Not on file    Attends religious service: Not on file    Active member of club or organization: Not on file    Attends meetings of clubs or organizations: Not on file    Relationship status: Not on file  . Intimate partner violence:    Fear of current or ex partner: Not on file    Emotionally abused: Not on file    Physically abused: Not on file   Forced sexual activity: Not on file  Other Topics Concern  . Not on file  Social History Narrative  . Not on file   History reviewed. No pertinent family history.   OBJECTIVE:  Vitals:   03/04/19 1533  BP: 99/61  Pulse: 73  Resp: 16  Temp: 97.7 F (36.5 C)  TempSrc: Oral  SpO2: 98%     General appearance: alert; no distress Skin: 1-2 cm superficial laceration to right 2nd finger, no erythema, discharge or bleeding (see picture below) Psychological: alert and cooperative; normal mood and affect     ASSESSMENT & PLAN:  1. Laceration of right index finger without foreign body without damage to nail, initial encounter     No orders of the defined types were placed in this encounter.  Steri strips applied Keep dry for the next 24 hours Wash gently with wash water and mild soap DO NOT submerge your hand in water Return or follow up with PCP if symptoms persists or worsen Return or go to the ED if you have any new or worsening symptoms such as fever, chills, redness, swelling, discharge, persistent bleeding, etc...  Reviewed expectations re: course of current medical issues. Questions answered. Outlined signs and  symptoms indicating need for more acute intervention. Patient verbalized understanding. After Visit Summary given.   Rennis Harding, PA-C 03/05/19 [phone]

## 2019-03-05 ENCOUNTER — Telehealth (HOSPITAL_COMMUNITY): Payer: Self-pay | Admitting: Emergency Medicine

## 2019-03-05 MED ORDER — METRONIDAZOLE 500 MG PO TABS: 500.0000 mg | ORAL_TABLET | Freq: Two times a day (BID) | ORAL | 0 refills | Status: DC

## 2019-03-05 MED ORDER — FLUCONAZOLE 150 MG PO TABS: 150.0000 mg | ORAL_TABLET | Freq: Every day | ORAL | 2 refills | Status: DC

## 2019-03-05 NOTE — Telephone Encounter (Signed)
Bacterial vaginosis is positive. This was not treated at the urgent care visit.  Flagyl 500 mg BID x 7 days #14 no refills sent to patients pharmacy of choice.    Test for candida (yeast) was positive.  Prescription for fluconazole 150mg  po now, repeat dose in 3d if needed, #2 no refills, sent to the pharmacy of record.  Recheck or followup with PCP for further evaluation if symptoms are not improving.    Verbalized understanding of results

## 2019-07-26 ENCOUNTER — Other Ambulatory Visit: Payer: Self-pay

## 2019-07-26 ENCOUNTER — Inpatient Hospital Stay (HOSPITAL_COMMUNITY): Payer: Medicaid Other

## 2019-07-26 ENCOUNTER — Encounter (HOSPITAL_COMMUNITY): Payer: Self-pay | Admitting: Certified Nurse Midwife

## 2019-07-26 ENCOUNTER — Inpatient Hospital Stay (HOSPITAL_COMMUNITY)
Admission: AD | Admit: 2019-07-26 | Discharge: 2019-07-26 | Disposition: A | Discharge status: Home / Self Care | Payer: Medicaid Other | Attending: Obstetrics and Gynecology | Admitting: Obstetrics and Gynecology

## 2019-07-26 DIAGNOSIS — B9689 Other specified bacterial agents as the cause of diseases classified elsewhere: Secondary | ICD-10-CM | POA: Diagnosis not present

## 2019-07-26 DIAGNOSIS — O209 Hemorrhage in early pregnancy, unspecified: Secondary | ICD-10-CM | POA: Diagnosis not present

## 2019-07-26 DIAGNOSIS — Z7722 Contact with and (suspected) exposure to environmental tobacco smoke (acute) (chronic): Secondary | ICD-10-CM | POA: Diagnosis not present

## 2019-07-26 DIAGNOSIS — O468X1 Other antepartum hemorrhage, first trimester: Secondary | ICD-10-CM | POA: Diagnosis not present

## 2019-07-26 DIAGNOSIS — O418X10 Other specified disorders of amniotic fluid and membranes, first trimester, not applicable or unspecified: Secondary | ICD-10-CM

## 2019-07-26 DIAGNOSIS — O208 Other hemorrhage in early pregnancy: Secondary | ICD-10-CM | POA: Insufficient documentation

## 2019-07-26 DIAGNOSIS — Z3491 Encounter for supervision of normal pregnancy, unspecified, first trimester: Secondary | ICD-10-CM

## 2019-07-26 DIAGNOSIS — Z3A08 8 weeks gestation of pregnancy: Secondary | ICD-10-CM | POA: Diagnosis not present

## 2019-07-26 DIAGNOSIS — O23591 Infection of other part of genital tract in pregnancy, first trimester: Secondary | ICD-10-CM | POA: Insufficient documentation

## 2019-07-26 HISTORY — DX: Other specified health status: Z78.9

## 2019-07-26 LAB — CBC
HCT: 36.3 % (ref 36.0–46.0)
Hemoglobin: 12.5 g/dL (ref 12.0–15.0)
MCH: 29.5 pg (ref 26.0–34.0)
MCHC: 34.4 g/dL (ref 30.0–36.0)
MCV: 85.6 fL (ref 80.0–100.0)
Platelets: 301 10*3/uL (ref 150–400)
RBC: 4.24 MIL/uL (ref 3.87–5.11)
RDW: 11.4 % — ABNORMAL LOW (ref 11.5–15.5)
WBC: 9 10*3/uL (ref 4.0–10.5)
nRBC: 0 % (ref 0.0–0.2)

## 2019-07-26 LAB — URINALYSIS, ROUTINE W REFLEX MICROSCOPIC
Bilirubin Urine: NEGATIVE
Glucose, UA: NEGATIVE mg/dL
Hgb urine dipstick: NEGATIVE
Ketones, ur: 80 mg/dL — AB
Leukocytes,Ua: NEGATIVE
Nitrite: NEGATIVE
Protein, ur: 30 mg/dL — AB
Specific Gravity, Urine: 1.029 (ref 1.005–1.030)
pH: 5 (ref 5.0–8.0)

## 2019-07-26 LAB — WET PREP, GENITAL
Sperm: NONE SEEN
Trich, Wet Prep: NONE SEEN
Yeast Wet Prep HPF POC: NONE SEEN

## 2019-07-26 LAB — HCG, QUANTITATIVE, PREGNANCY: hCG, Beta Chain, Quant, S: 147524 m[IU]/mL — ABNORMAL HIGH (ref <5)

## 2019-07-26 LAB — ABO/RH: ABO/RH(D): AB POS

## 2019-07-26 LAB — POCT PREGNANCY, URINE: Preg Test, Ur: POSITIVE — AB

## 2019-07-26 MED ORDER — METRONIDAZOLE 500 MG PO TABS: 500.0000 mg | ORAL_TABLET | Freq: Two times a day (BID) | ORAL | 0 refills | Status: DC

## 2019-07-26 NOTE — MAU Note (Signed)
About 3 wks ago, she had +HPT.. missed her appt this morning. Started spotting 2 days ago, now is more like flow, getting brighter. (not soaking pads).  Having pain in upper abd and mid back, off and on.  Has the worse migraine - it was a 10, but it went away now.

## 2019-07-26 NOTE — Discharge Instructions (Signed)
[place] for [place] at [facility]       Phone: [phone]  [facility] for [place] at [place] Phone: [place] for [place] at [place]  Phone: [place] for [place] at [facility]  Phone: [place] for [place] at [facility]  Phone: [facility]       Phone: [phone]  [facility]    Phone: [phone]   [facility] [place])    Phone: [facility]    Phone: [phone]  [facility]    Phone: [place] [facility]  Phone: [place]    Phone: [phone]  [facility]   Phone: [phone]  [facility]    Phone: [phone]      Subchorionic Hematoma  A subchorionic hematoma is a gathering of blood between the outer wall of the embryo (chorion) and the inner wall of the womb (uterus). This condition can cause vaginal bleeding. If they cause little or no vaginal bleeding, early small hematomas usually shrink on their own and do not affect your baby or pregnancy. When bleeding starts later in pregnancy, or if the hematoma is larger or occurs in older pregnant women, the condition may be more serious. Larger hematomas may get bigger, which increases the chances of miscarriage. This condition also increases the risk of:  Premature separation of the placenta from the uterus.  Premature (preterm) labor.  Stillbirth. What are the causes? The exact cause of this condition is not known. It occurs when blood is trapped between the placenta and the uterine wall because the placenta has separated from the original site of implantation. What increases the risk? You are more likely to develop this condition  if:  You were treated with fertility medicines.  You conceived through in vitro fertilization (IVF). What are the signs or symptoms? Symptoms of this condition include:  Vaginal spotting or bleeding.  Contractions of the uterus. These cause abdominal pain. Sometimes you may have no symptoms and the bleeding may only be seen when ultrasound images are taken (transvaginal ultrasound). How is this diagnosed? This condition is diagnosed based on a physical exam. This includes a pelvic exam. You may also have other tests, including:  Blood tests.  Urine tests.  Ultrasound of the abdomen. How is this treated? Treatment for this condition can vary. Treatment may include:  Watchful waiting. You will be monitored closely for any changes in bleeding. During this stage: ? The hematoma may be reabsorbed by the body. ? The hematoma may separate the fluid-filled space containing the embryo (gestational sac) from the wall of the womb (endometrium).  Medicines.  Activity restriction. This may be needed until the bleeding stops. Follow these instructions at home:  Stay on bed rest if told to do so by your health care provider.  Do not lift anything that is heavier than 10 lbs. (4.5 kg) or as told by your health care provider.  Do not use any products that contain nicotine or tobacco, such as cigarettes and e-cigarettes. If you need help quitting, ask your health care provider.  Track and write down the number of pads you use each day and how soaked (saturated) they are.  Do not use tampons.  Keep all follow-up visits as told by your health care provider. This is important. Your  care provider may ask you to have follow-up blood tests or ultrasound tests or both. °Contact a health care provider if: °· You have any vaginal bleeding. °· You have a fever. °Get help right away if: °· You have severe cramps in your stomach, back, abdomen, or pelvis. °· You pass large clots or tissue. Save  any tissue for your health care provider to look at. °· You have more vaginal bleeding, and you faint or become lightheaded or weak. °Summary °· A subchorionic hematoma is a gathering of blood between the outer wall of the placenta and the uterus. °· This condition can cause vaginal bleeding. °· Sometimes you may have no symptoms and the bleeding may only be seen when ultrasound images are taken. °· Treatment may include watchful waiting, medicines, or activity restriction. °This information is not intended to replace advice given to you by your health care provider. Make sure you discuss any questions you have with your health care provider. °Document Released: 04/02/2007 Document Revised: 11/28/2017 Document Reviewed: 02/11/2017 °Elsevier Patient Education © 2020 Elsevier Inc[employer] ° °

## 2019-07-26 NOTE — MAU Provider Note (Signed)
Chief Complaint: Vaginal Bleeding, Abdominal Pain, Back Pain, and Possible Pregnancy   First Provider Initiated Contact with Patient 07/26/19 2102     SUBJECTIVE HPI: Kristi Roth is a 23 y.o. G1P0 at 6744w5d by LMP who presents to [facility] Admissions reporting vaginal bleeding. Reports spotting over the weekend that increased today. Not saturating pads but is bright red. Has some epigastric pain occasionally. Denies lower abdominal pain, vaginal discharge, or dysuria.    Past Medical History:  Diagnosis Date  . Abdominal pain   . Chlamydia 07/2015  . Gonorrhea 02/2018  . Medical history non-contributory    OB History  Gravida Para Term Preterm AB Living  1            SAB TAB Ectopic Multiple Live Births               # Outcome Date GA Lbr Len/2nd Weight Sex Delivery Anes PTL Lv  1 Current            Past Surgical History:  Procedure Laterality Date  . NO PAST SURGERIES     Social History   Socioeconomic History  . Marital status: Single    Spouse name: Not on file  . Number of children: Not on file  . Years of education: Not on file  . Highest education level: Not on file  Occupational History  . Not on file  Social Needs  . Financial resource strain: Not on file  . Food insecurity    Worry: Not on file    Inability: Not on file  . Transportation needs    Medical: Not on file    Non-medical: Not on file  Tobacco Use  . Smoking status: Passive Smoke Exposure - Never Smoker  . Smokeless tobacco: Never Used  Substance and Sexual Activity  . Alcohol use: No  . Drug use: No  . Sexual activity: Yes  Lifestyle  . Physical activity    Days per week: Not on file    Minutes per session: Not on file  . Stress: Not on file  Relationships  . Social Musicianconnections    Talks on phone: Not on file    Gets together: Not on file    Attends religious service: Not on file    Active member of club or organization: Not on file    Attends meetings of clubs or organizations: Not  on file    Relationship status: Not on file  . Intimate partner violence    Fear of current or ex partner: Not on file    Emotionally abused: Not on file    Physically abused: Not on file    Forced sexual activity: Not on file  Other Topics Concern  . Not on file  Social History Narrative  . Not on file   No family history on file. No current facility-administered medications on file prior to encounter.    Current Outpatient Medications on File Prior to Encounter  Medication Sig Dispense Refill  . Prenatal Vit-Fe Fumarate-FA (PRENATAL MULTIVITAMIN) TABS tablet Take 1 tablet by mouth daily at 12 noon.    . fluconazole (DIFLUCAN) 150 MG tablet Take 1 tablet (150 mg total) by mouth daily. 2 tablet 2  . metroNIDAZOLE (FLAGYL) 500 MG tablet Take 1 tablet (500 mg total) by mouth 2 (two) times daily. 14 tablet 0   Allergies  Allergen Reactions  . Latex Dermatitis    I have reviewed patient's Past Medical Hx, Surgical Hx, Family Hx, Social Hx,  medications and allergies.   Review of Systems  Constitutional: Negative.   Gastrointestinal: Negative.   Genitourinary: Positive for vaginal bleeding. Negative for dysuria and vaginal discharge.    OBJECTIVE Patient Vitals for the past 24 hrs:  BP Temp Temp src Pulse Resp SpO2 Weight  07/26/19 2056 (!) 125/46 - - 79 - 100 % -  07/26/19 1826 (!) 118/47 98.5 F (36.9 C) Oral 80 17 100 % 67.4 kg   Constitutional: Well-developed, well-nourished female in no acute distress.  Cardiovascular: normal rate & rhythm, no murmur Respiratory: normal rate and effort. Lung sounds clear throughout GI: Abd soft, non-tender, Pos BS x 4. No guarding or rebound tenderness MS: Extremities nontender, no edema, normal ROM Neurologic: Alert and oriented x 4.  GU:     SPECULUM EXAM: NEFG, moderate amount of tan foul smelling discharge. No blood.   BIMANUAL: No CMT. cervix closed; uterus enlarged, no adnexal tenderness or masses.    LAB RESULTS Results for  orders placed or performed during the hospital encounter of 07/26/19 (from the past 24 hour(s))  Pregnancy, urine POC     Status: Abnormal   Collection Time: 07/26/19  6:35 PM  Result Value Ref Range   Preg Test, Ur POSITIVE (A) NEGATIVE  Urinalysis, Routine w reflex microscopic     Status: Abnormal   Collection Time: 07/26/19  6:39 PM  Result Value Ref Range   Color, Urine YELLOW YELLOW   APPearance CLEAR CLEAR   Specific Gravity, Urine 1.029 1.005 - 1.030   pH 5.0 5.0 - 8.0   Glucose, UA NEGATIVE NEGATIVE mg/dL   Hgb urine dipstick NEGATIVE NEGATIVE   Bilirubin Urine NEGATIVE NEGATIVE   Ketones, ur 80 (A) NEGATIVE mg/dL   Protein, ur 30 (A) NEGATIVE mg/dL   Nitrite NEGATIVE NEGATIVE   Leukocytes,Ua NEGATIVE NEGATIVE   RBC / HPF 0-5 0 - 5 RBC/hpf   WBC, UA 0-5 0 - 5 WBC/hpf   Bacteria, UA RARE (A) NONE SEEN   Squamous Epithelial / LPF 0-5 0 - 5   Mucus PRESENT    Hyaline Casts, UA PRESENT   CBC     Status: Abnormal   Collection Time: 07/26/19  9:21 PM  Result Value Ref Range   WBC 9.0 4.0 - 10.5 K/uL   RBC 4.24 3.87 - 5.11 MIL/uL   Hemoglobin 12.5 12.0 - 15.0 g/dL   HCT 36.3 36.0 - 46.0 %   MCV 85.6 80.0 - 100.0 fL   MCH 29.5 26.0 - 34.0 pg   MCHC 34.4 30.0 - 36.0 g/dL   RDW 11.4 (L) 11.5 - 15.5 %   Platelets 301 150 - 400 K/uL   nRBC 0.0 0.0 - 0.2 %  ABO/Rh     Status: None   Collection Time: 07/26/19  9:21 PM  Result Value Ref Range   ABO/RH(D) AB POS    No rh immune globuloin      NOT A RH IMMUNE GLOBULIN CANDIDATE, PT RH POSITIVE Performed at [facility], [address] [place], [place] [address]   hCG, quantitative, pregnancy     Status: Abnormal   Collection Time: 07/26/19  9:21 PM  Result Value Ref Range   hCG, Beta Chain, Quant, S 147,524 (H) <5 mIU/mL  Wet prep, genital     Status: Abnormal   Collection Time: 07/26/19  9:33 PM  Result Value Ref Range   Yeast Wet Prep HPF POC NONE SEEN NONE SEEN   Trich, Wet Prep NONE SEEN  NONE SEEN   Clue Cells  Wet Prep HPF POC PRESENT (A) NONE SEEN   WBC, Wet Prep HPF POC MANY (A) NONE SEEN   Sperm NONE SEEN     IMAGING No results found.  MAU COURSE Orders Placed This Encounter  Procedures  . Wet prep, genital  . US OB LESS THAN 14 WEEKS WITH OB TRANSVAGINAL  . Urinalysis, Routine w reflex microscopic  . CBC  . hCG, quantitative, pregnancy  . HIV Antibody (routine testing w rflx)  . Pregnancy, urine POC  . ABO/Rh  . Discharge patient   Meds ordered this encounter  Medications  . metroNIDAZOLE (FLAGYL) 500 MG tablet    Sig: Take 1 tablet (500 mg total) by mouth 2 (two) times daily.    Dispense:  14 tablet    Refill:  0    Order Specific Question:   Supervising Provider    Answer:   [place] BingPICKENS, CHARLIE [[id number]]    MDM +UPT UA, wet prep, GC/chlamydia, CBC, ABO/Rh, quant hCG, and US today to rule out ectopic pregnancy  No blood on exam RH positive  Ultrasound [facility] live [facility] & [facility]  Wet prep + clue cells, will tx for BV  ASSESSMENT 1. Subchorionic hematoma in first trimester, single or unspecified fetus   2. Vaginal bleeding in pregnancy, first trimester   3. Normal IUP (intrauterine pregnancy) on prenatal ultrasound, first trimester   4. Bacterial vaginosis     PLAN Discharge home in stable condition. Bleeding precautions Rx flagyl Start prenatal care GC/CT pending  Follow-up Information    [facility] Follow up.   Specialty: Obstetrics and Gynecology Why: return for worsening symptoms Contact information: [address] [id number] [place] [id number] [phone]         Allergies as of 07/26/2019      Reactions   Latex Dermatitis      Medication List    STOP taking these medications   fluconazole 150 MG tablet Commonly known as: Diflucan     TAKE these medications   metroNIDAZOLE 500 MG tablet Commonly known as: FLAGYL Take 1 tablet (500 mg total) by mouth 2 (two) times daily.   prenatal  multivitamin Tabs tablet Take 1 tablet by mouth daily at 12 noon.        Judeth HornLawrence, Haely Leyland, NP 07/26/2019  10:55 PM

## 2019-07-27 LAB — HIV ANTIBODY (ROUTINE TESTING W REFLEX): HIV Screen 4th Generation wRfx: NONREACTIVE

## 2019-07-28 LAB — GC/CHLAMYDIA PROBE AMP (~~LOC~~) NOT AT ARMC
Chlamydia: NEGATIVE
Neisseria Gonorrhea: NEGATIVE

## 2019-08-26 ENCOUNTER — Ambulatory Visit (INDEPENDENT_AMBULATORY_CARE_PROVIDER_SITE_OTHER): Payer: Medicaid Other | Admitting: Certified Nurse Midwife

## 2019-08-26 ENCOUNTER — Encounter: Payer: Self-pay | Admitting: Certified Nurse Midwife

## 2019-08-26 ENCOUNTER — Other Ambulatory Visit (HOSPITAL_COMMUNITY)
Admission: RE | Admit: 2019-08-26 | Discharge: 2019-08-26 | Disposition: A | Discharge status: Home / Self Care | Payer: Medicaid Other | Source: Ambulatory Visit | Attending: Certified Nurse Midwife | Admitting: Certified Nurse Midwife

## 2019-08-26 ENCOUNTER — Other Ambulatory Visit: Payer: Self-pay

## 2019-08-26 VITALS — BP 109/61 | HR 88 | Temp 98.8°F | Wt 149.0 lb

## 2019-08-26 DIAGNOSIS — O99891 Other specified diseases and conditions complicating pregnancy: Secondary | ICD-10-CM

## 2019-08-26 DIAGNOSIS — Z3400 Encounter for supervision of normal first pregnancy, unspecified trimester: Secondary | ICD-10-CM | POA: Insufficient documentation

## 2019-08-26 DIAGNOSIS — Z3481 Encounter for supervision of other normal pregnancy, first trimester: Secondary | ICD-10-CM

## 2019-08-26 DIAGNOSIS — Z3A13 13 weeks gestation of pregnancy: Secondary | ICD-10-CM

## 2019-08-26 DIAGNOSIS — O9989 Other specified diseases and conditions complicating pregnancy, childbirth and the puerperium: Secondary | ICD-10-CM

## 2019-08-26 DIAGNOSIS — R8271 Bacteriuria: Secondary | ICD-10-CM

## 2019-08-26 DIAGNOSIS — O0990 Supervision of high risk pregnancy, unspecified, unspecified trimester: Secondary | ICD-10-CM | POA: Insufficient documentation

## 2019-08-26 DIAGNOSIS — M549 Dorsalgia, unspecified: Secondary | ICD-10-CM

## 2019-08-26 MED ORDER — VITAFOL GUMMIES 3.33-0.333-34.8 MG PO CHEW: 3.0000 | CHEWABLE_TABLET | Freq: Every day | ORAL | 3 refills | Status: DC

## 2019-08-26 MED ORDER — BLOOD PRESSURE MONITOR KIT: 1.0000 | PACK | 0 refills | Status: DC

## 2019-08-26 NOTE — Progress Notes (Addendum)
Subjective:   Kristi Roth is a 23 y.o. G1P0 at 94w1dby early ultrasound being seen today for her first obstetrical visit. Pregnancy history fully reviewed.  Patient reports backache that radiates down into her right buttock. She denies abdominal pain, vaginal bleeding, vaginal discharge or urinary symptoms.   HISTORY: OB History  Gravida Para Term Preterm AB Living  1 0 0 0 0 0  SAB TAB Ectopic Multiple Live Births  0 0 0 0 0    # Outcome Date GA Lbr Len/2nd Weight Sex Delivery Anes PTL Lv  1 Current            Past Medical History:  Diagnosis Date  . Abdominal pain   . Chlamydia 07/2015  . Gonorrhea 02/2018  . Medical history non-contributory    Past Surgical History:  Procedure Laterality Date  . NO PAST SURGERIES     History reviewed. No pertinent family history. Social History   Tobacco Use  . Smoking status: Never Smoker  . Smokeless tobacco: Never Used  Substance Use Topics  . Alcohol use: No  . Drug use: No   Allergies  Allergen Reactions  . Latex Dermatitis   Current Outpatient Medications on File Prior to Visit  Medication Sig Dispense Refill  . Prenatal Vit-Fe Fumarate-FA (PRENATAL MULTIVITAMIN) TABS tablet Take 1 tablet by mouth daily at 12 noon.    . metroNIDAZOLE (FLAGYL) 500 MG tablet Take 1 tablet (500 mg total) by mouth 2 (two) times daily. (Patient not taking: Reported on 08/26/2019) 14 tablet 0   No current facility-administered medications on file prior to visit.     Indications for ASA therapy (per uptodate) One of the following: Previous pregnancy with preeclampsia, especially early onset and with an adverse outcome No Multifetal gestation No Chronic hypertension No Type 1 or 2 diabetes mellitus No Chronic kidney disease No Autoimmune disease (antiphospholipid syndrome, systemic lupus erythematosus) No  Two or more of the following: Nulliparity Yes Obesity (body mass index >30 kg/m2) No Family history of preeclampsia in mother  or sister No Age ?35 years No Sociodemographic characteristics (African American race, low socioeconomic level) Yes Personal risk factors (eg, previous pregnancy with low birth weight or small for gestational age infant, previous adverse pregnancy outcome [eg, stillbirth], interval >10 years between pregnancies) No  Indications for early 1 hour GTT (per uptodate)  BMI >25 (>23 in Asian women) AND one of the following  Gestational diabetes mellitus in a previous pregnancy No Glycated hemoglobin ?5.7 percent (39 mmol/mol), impaired glucose tolerance, or impaired fasting glucose on previous testing No First-degree relative with diabetes No High-risk race/ethnicity (eg, African American, Latino, Native American, [place], Pacific Islander) Yes History of cardiovascular disease No Hypertension or on therapy for hypertension No High-density lipoprotein cholesterol level <35 mg/dL (0.90 mmol/L) and/or a triglyceride level >250 mg/dL (2.82 mmol/L) No Polycystic ovary syndrome No Physical inactivity No Other clinical condition associated with insulin resistance (eg, severe obesity, acanthosis nigricans) No Previous birth of an infant weighing ?4000 g No Previous stillbirth of unknown cause No   Exam   Vitals:   08/26/19 1356  BP: 109/61  Pulse: 88  Temp: 98.8 F (37.1 C)  Weight: 67.6 kg   Fetal Heart Rate (bpm): 166  Pelvic Exam: Perineum: no hemorrhoids, normal perineum   Vulva: normal external genitalia, no lesions or excoriation   Vagina:  Vaginal mucosa pink, rugae present, scant white discharge   Cervix: no lesions, pink, pap smear obtained, no  CMT   Adnexa: normal adnexa and no mass, fullness, tenderness   Bony Pelvis: average  System: General: well-developed, well-nourished female in no acute distress   Breast:  normal appearance, no masses or tenderness   Skin: normal coloration and turgor, no rashes   Neurologic: oriented, normal, negative, normal mood    Extremities: normal strength, tone, and muscle mass, ROM of all joints is normal   HEENT PERRLA, extraocular movement intact and sclera clear, anicteric   Mouth/Teeth mucous membranes moist, pharynx normal without lesions and dental hygiene good   Neck supple and no masses   Cardiovascular: regular rate and rhythm   Respiratory:  no respiratory distress, normal breath sounds   Abdomen: soft, non-tender; bowel sounds normal; no masses,  no organomegaly     Assessment:   Pregnancy: G1P0 Patient Active Problem List   Diagnosis Date Noted  . Supervision of normal first pregnancy, antepartum 08/26/2019  . Abdominal pain      Plan:  1. Supervision of normal first pregnancy, antepartum - Routine new OB visit - Pt is very excited about this pregnancy! - Has lots of questions concerning food/diet during pregnancy - New OB labs as well as Pap obtained - Discussed need for increased water intake - Educated on foods safe during pregnancy  - Anticipatory guidance on upcoming appointments  - Obstetric Panel, Including HIV - Culture, OB Urine - Genetic Screening - Cytology - PAP( [place]) - Blood Pressure Monitor KIT; 1 Device by Does not apply route once a week. To be monitored Regularly at home.  Dispense: 1 kit; Refill: 0 - Babyscripts Schedule Optimization - [place] MFM OB COMP + 14 WK; Future - Prenatal Vit-Fe Phos-FA-Omega (VITAFOL GUMMIES) 3.33-0.333-34.8 MG CHEW; Chew 3 tablets by mouth daily.  Dispense: 90 tablet; Refill: 3  2. Back pain affecting pregnancy in first trimester - C/o mild lower back pain that radiates into right buttocks.  - Encouraged the use of a pregnancy support belt - Discussed reasons to call the office or be evaluated in MAU  Initial labs drawn. Continue prenatal vitamins. Genetic Screening discussed, NIPS: requested. Ultrasound discussed; fetal anatomic survey: ordered. Problem list reviewed and updated The nature of March [facility] for [facility] with multiple MDs and other Advanced Practice Providers was explained to patient; also emphasized that fellows, residents, and students are part of our team. Routine obstetric precautions reviewed Return in about 4 weeks (around 09/23/2019) for ROB/AFP.   Renee Harder, SNM 2:44 PM 08/26/19

## 2019-08-26 NOTE — Patient Instructions (Signed)
Safe Medications in Pregnancy   Acne: Benzoyl Peroxide Salicylic Acid  Backache/Headache: Tylenol: 2 regular strength every 4 hours OR              2 Extra strength every 6 hours  Colds/Coughs/Allergies: Benadryl (alcohol free) 25 mg every 6 hours as needed Breath right strips Claritin Cepacol throat lozenges Chloraseptic throat spray Cold-Eeze- up to three times per day Cough drops, alcohol free Flonase (by prescription only) Guaifenesin Mucinex Robitussin DM (plain only, alcohol free) Saline nasal spray/drops Sudafed (pseudoephedrine) & Actifed ** use only after [redacted] weeks gestation and if you do not have high blood pressure Tylenol Vicks Vaporub Zinc lozenges Zyrtec   Constipation: Colace Ducolax suppositories Fleet enema Glycerin suppositories Metamucil Milk of magnesia Miralax Senokot Smooth move tea  Diarrhea: Kaopectate Imodium A-D  *NO pepto Bismol  Hemorrhoids: Anusol Anusol HC Preparation H Tucks  Indigestion: Tums Maalox Mylanta Zantac  Pepcid  Insomnia: Benadryl (alcohol free) 25mg every 6 hours as needed Tylenol PM Unisom, no Gelcaps  Leg Cramps: Tums MagGel  Nausea/Vomiting:  Bonine Dramamine Emetrol Ginger extract Sea bands Meclizine  Nausea medication to take during pregnancy:  Unisom (doxylamine succinate 25 mg tablets) Take one tablet daily at bedtime. If symptoms are not adequately controlled, the dose can be increased to a maximum recommended dose of two tablets daily (1/2 tablet in the morning, 1/2 tablet mid-afternoon and one at bedtime). Vitamin B6 100mg tablets. Take one tablet twice a day (up to 200 mg per day).  Skin Rashes: Aveeno products Benadryl cream or 25mg every 6 hours as needed Calamine Lotion 1% cortisone cream  Yeast infection: Gyne-lotrimin 7 Monistat 7   **If taking multiple medications, please check labels to avoid duplicating the same active ingredients **take medication as directed on  the label ** Do not exceed 4000 mg of tylenol in 24 hours **Do not take medications that contain aspirin or ibuprofen     Second Trimester of Pregnancy  The second trimester is from week 14 through week 27 (month 4 through 6). This is often the time in pregnancy that you feel your best. Often times, morning sickness has lessened or quit. You may have more energy, and you may get hungry more often. Your unborn baby is growing rapidly. At the end of the sixth month, he or she is about 9 inches long and weighs about 1 pounds. You will likely feel the baby move between 18 and 20 weeks of pregnancy. Follow these instructions at home: Medicines  Take over-the-counter and prescription medicines only as told by your doctor. Some medicines are safe and some medicines are not safe during pregnancy.  Take a prenatal vitamin that contains at least 600 micrograms (mcg) of folic acid.  If you have trouble pooping (constipation), take medicine that will make your stool soft (stool softener) if your doctor approves. Eating and drinking   Eat regular, healthy meals.  Avoid raw meat and uncooked cheese.  If you get low calcium from the food you eat, talk to your doctor about taking a daily calcium supplement.  Avoid foods that are high in fat and sugars, such as fried and sweet foods.  If you feel sick to your stomach (nauseous) or throw up (vomit): ? Eat 4 or 5 small meals a day instead of 3 large meals. ? Try eating a few soda crackers. ? Drink liquids between meals instead of during meals.  To prevent constipation: ? Eat foods that are high in fiber, like fresh fruits   and vegetables, whole grains, and beans. ? Drink enough fluids to keep your pee (urine) clear or pale yellow. Activity  Exercise only as told by your doctor. Stop exercising if you start to have cramps.  Do not exercise if it is too hot, too humid, or if you are in a place of great height (high altitude).  Avoid heavy  lifting.  Wear low-heeled shoes. Sit and stand up straight.  You can continue to have sex unless your doctor tells you not to. Relieving pain and discomfort  Wear a good support bra if your breasts are tender.  Take warm water baths (sitz baths) to soothe pain or discomfort caused by hemorrhoids. Use hemorrhoid cream if your doctor approves.  Rest with your legs raised if you have leg cramps or low back pain.  If you develop puffy, bulging veins (varicose veins) in your legs: ? Wear support hose or compression stockings as told by your doctor. ? Raise (elevate) your feet for 15 minutes, 3-4 times a day. ? Limit salt in your food. Prenatal care  Write down your questions. Take them to your prenatal visits.  Keep all your prenatal visits as told by your doctor. This is important. Safety  Wear your seat belt when driving.  Make a list of emergency phone numbers, including numbers for family, friends, the hospital, and police and fire departments. General instructions  Ask your doctor about the right foods to eat or for help finding a counselor, if you need these services.  Ask your doctor about local prenatal classes. Begin classes before month 6 of your pregnancy.  Do not use hot tubs, steam rooms, or saunas.  Do not douche or use tampons or scented sanitary pads.  Do not cross your legs for long periods of time.  Visit your dentist if you have not done so. Use a soft toothbrush to brush your teeth. Floss gently.  Avoid all smoking, herbs, and alcohol. Avoid drugs that are not approved by your doctor.  Do not use any products that contain nicotine or tobacco, such as cigarettes and e-cigarettes. If you need help quitting, ask your doctor.  Avoid cat litter boxes and soil used by cats. These carry germs that can cause birth defects in the baby and can cause a loss of your baby (miscarriage) or stillbirth. Contact a doctor if:  You have mild cramps or pressure in your  lower belly.  You have pain when you pee (urinate).  You have bad smelling fluid coming from your vagina.  You continue to feel sick to your stomach (nauseous), throw up (vomit), or have watery poop (diarrhea).  You have a nagging pain in your belly area.  You feel dizzy. Get help right away if:  You have a fever.  You are leaking fluid from your vagina.  You have spotting or bleeding from your vagina.  You have severe belly cramping or pain.  You lose or gain weight rapidly.  You have trouble catching your breath and have chest pain.  You notice sudden or extreme puffiness (swelling) of your face, hands, ankles, feet, or legs.  You have not felt the baby move in over an hour.  You have severe headaches that do not go away when you take medicine.  You have trouble seeing. Summary  The second trimester is from week 14 through week 27 (months 4 through 6). This is often the time in pregnancy that you feel your best.  To take care of yourself and  your unborn baby, you will need to eat healthy meals, take medicines only if your doctor tells you to do so, and do activities that are safe for you and your baby.  Call your doctor if you get sick or if you notice anything unusual about your pregnancy. Also, call your doctor if you need help with the right food to eat, or if you want to know what activities are safe for you. This information is not intended to replace advice given to you by your health care provider. Make sure you discuss any questions you have with your health care provider. Document Released: 03/12/2010 Document Revised: 04/09/2019 Document Reviewed: 01/21/2017 Elsevier Patient Education  2020 Elsevier Inc.  

## 2019-08-26 NOTE — Progress Notes (Signed)
NOB U/S on 07/26/19.  Last pap:N/A due to age   Genetic Screening: Desires    CC: Back pain, constipation.  Pt wants Rx for Prenatal Gummies   *FOB per pt has heart disease

## 2019-08-27 LAB — OBSTETRIC PANEL, INCLUDING HIV
Antibody Screen: NEGATIVE
Basophils Absolute: 0.1 10*3/uL (ref 0.0–0.2)
Basos: 1 %
EOS (ABSOLUTE): 0 10*3/uL (ref 0.0–0.4)
Eos: 1 %
HIV Screen 4th Generation wRfx: NONREACTIVE
Hematocrit: 38.1 % (ref 34.0–46.6)
Hemoglobin: 12 g/dL (ref 11.1–15.9)
Hepatitis B Surface Ag: NEGATIVE
Immature Grans (Abs): 0 10*3/uL (ref 0.0–0.1)
Immature Granulocytes: 0 %
Lymphocytes Absolute: 2.2 10*3/uL (ref 0.7–3.1)
Lymphs: 35 %
MCH: 29 pg (ref 26.6–33.0)
MCHC: 31.5 g/dL (ref 31.5–35.7)
MCV: 92 fL (ref 79–97)
Monocytes Absolute: 0.3 10*3/uL (ref 0.1–0.9)
Monocytes: 4 %
Neutrophils Absolute: 3.7 10*3/uL (ref 1.4–7.0)
Neutrophils: 59 %
Platelets: 278 10*3/uL (ref 150–450)
RBC: 4.14 x10E6/uL (ref 3.77–5.28)
RDW: 13.2 % (ref 11.7–15.4)
RPR Ser Ql: NONREACTIVE
Rh Factor: POSITIVE
Rubella Antibodies, IGG: 13.8 index (ref >0.99)
WBC: 6.3 10*3/uL (ref 3.4–10.8)

## 2019-08-30 LAB — CYTOLOGY - PAP
Chlamydia: NEGATIVE
Diagnosis: NEGATIVE
Neisseria Gonorrhea: NEGATIVE
Trichomonas: NEGATIVE

## 2019-08-31 ENCOUNTER — Other Ambulatory Visit: Payer: Self-pay

## 2019-08-31 DIAGNOSIS — Z3400 Encounter for supervision of normal first pregnancy, unspecified trimester: Secondary | ICD-10-CM

## 2019-08-31 MED ORDER — BLOOD PRESSURE MONITOR KIT: 1.0000 | PACK | 0 refills | Status: DC

## 2019-09-01 ENCOUNTER — Other Ambulatory Visit: Payer: Self-pay

## 2019-09-01 DIAGNOSIS — Z3400 Encounter for supervision of normal first pregnancy, unspecified trimester: Secondary | ICD-10-CM

## 2019-09-01 MED ORDER — BLOOD PRESSURE MONITOR KIT: 1.0000 | PACK | 0 refills | Status: DC

## 2019-09-02 ENCOUNTER — Encounter: Payer: Self-pay | Admitting: Certified Nurse Midwife

## 2019-09-04 LAB — URINE CULTURE, OB REFLEX

## 2019-09-04 LAB — CULTURE, OB URINE

## 2019-09-06 ENCOUNTER — Encounter: Payer: Self-pay | Admitting: Certified Nurse Midwife

## 2019-09-06 DIAGNOSIS — R8271 Bacteriuria: Secondary | ICD-10-CM | POA: Insufficient documentation

## 2019-09-06 MED ORDER — AMOXICILLIN 500 MG PO CAPS: 500.0000 mg | ORAL_CAPSULE | Freq: Three times a day (TID) | ORAL | 0 refills | Status: AC

## 2019-09-06 NOTE — Addendum Note (Signed)
Addended by: Lajean Manes on: 09/06/2019 07:50 AM   Modules accepted: Orders

## 2019-09-07 ENCOUNTER — Encounter: Payer: Self-pay | Admitting: Certified Nurse Midwife

## 2019-09-10 ENCOUNTER — Encounter: Payer: Self-pay | Admitting: Certified Nurse Midwife

## 2019-09-15 ENCOUNTER — Other Ambulatory Visit: Payer: Self-pay

## 2019-09-15 DIAGNOSIS — Z3400 Encounter for supervision of normal first pregnancy, unspecified trimester: Secondary | ICD-10-CM

## 2019-09-15 DIAGNOSIS — K5900 Constipation, unspecified: Secondary | ICD-10-CM

## 2019-09-15 MED ORDER — MISC. DEVICES MISC: 0 refills | Status: DC

## 2019-09-15 MED ORDER — DOCUSATE SODIUM 100 MG PO CAPS: 100.0000 mg | ORAL_CAPSULE | Freq: Two times a day (BID) | ORAL | 2 refills | Status: DC | PRN

## 2019-09-15 NOTE — Progress Notes (Signed)
Rx for support belt printed for pt due to discomfort . Pt made aware of red flags to go to MAU and all other comfort measures.  Pt will pick up Rx today.

## 2019-09-17 ENCOUNTER — Encounter: Payer: Self-pay | Admitting: Obstetrics and Gynecology

## 2019-09-23 ENCOUNTER — Other Ambulatory Visit: Payer: Self-pay

## 2019-09-23 ENCOUNTER — Ambulatory Visit (INDEPENDENT_AMBULATORY_CARE_PROVIDER_SITE_OTHER): Payer: Medicaid Other | Admitting: Obstetrics and Gynecology

## 2019-09-23 VITALS — BP 111/64 | HR 88 | Wt 167.0 lb

## 2019-09-23 DIAGNOSIS — Z3A17 17 weeks gestation of pregnancy: Secondary | ICD-10-CM

## 2019-09-23 DIAGNOSIS — Z3402 Encounter for supervision of normal first pregnancy, second trimester: Secondary | ICD-10-CM

## 2019-09-23 DIAGNOSIS — R8271 Bacteriuria: Secondary | ICD-10-CM

## 2019-09-23 DIAGNOSIS — Z3400 Encounter for supervision of normal first pregnancy, unspecified trimester: Secondary | ICD-10-CM

## 2019-09-23 NOTE — Progress Notes (Signed)
   PRENATAL VISIT NOTE  Subjective:  Kristi Roth is a 23 y.o. G1P0 at [redacted]w[redacted]d being seen today for ongoing prenatal care.  She is currently monitored for the following issues for this low-risk pregnancy and has Supervision of normal first pregnancy, antepartum and GBS bacteriuria on their problem list.  Patient reports "feels like bottom of stomach is stretching", reports her muscles are sore, feet and ankles and swollen.  Contractions: Not present. Vag. Bleeding: None.  Movement: Present. Denies leaking of fluid.   The following portions of the patient's history were reviewed and updated as appropriate: allergies, current medications, past family history, past medical history, past social history, past surgical history and problem list.   Objective:   Vitals:   09/23/19 1121  BP: 111/64  Pulse: 88  Weight: 167 lb (75.8 kg)    Fetal Status: Fetal Heart Rate (bpm): 164   Movement: Present     General:  Alert, oriented and cooperative. Patient is in no acute distress.  Skin: Skin is warm and dry. No rash noted.   Cardiovascular: Normal heart rate noted  Respiratory: Normal respiratory effort, no problems with respiration noted  Abdomen: Soft, gravid, appropriate for gestational age.  Pain/Pressure: Present     Pelvic: Cervical exam deferred        Extremities: Normal range of motion.  Edema: Trace  Mental Status: Normal mood and affect. Normal behavior. Normal judgment and thought content.   Assessment and Plan:  Pregnancy: G1P0 at [redacted]w[redacted]d  1. Supervision of normal first pregnancy, antepartum - AFP, Serum, Open Spina Bifida  2. GBS bacteriuria TOC late Oct ppx in labor  Preterm labor symptoms and general obstetric precautions including but not limited to vaginal bleeding, contractions, leaking of fluid and fetal movement were reviewed in detail with the patient. Please refer to After Visit Summary for other counseling recommendations.   Return in about 4 weeks (around  10/21/2019) for low OB, in person.  Future Appointments  Date Time Provider [place]  10/07/2019  1:15 PM [facility]-MFC [place] 4 WH-MFCUS MFC-US    Sloan Leiter, MD

## 2019-09-23 NOTE — Progress Notes (Signed)
Patient has not started feeling fetal movement, reports pain/pressure, pt states she was advised that pain was related to ligaments.

## 2019-09-30 LAB — AFP, SERUM, OPEN SPINA BIFIDA
AFP MoM: 0.84
AFP Value: 32.8 ng/mL
Gest. Age on Collection Date: 17 weeks
Maternal Age At EDD: 21.9 yr
OSBR Risk 1 IN: 10000
Test Results:: NEGATIVE
Weight: 167 [lb_av]

## 2019-10-06 ENCOUNTER — Other Ambulatory Visit: Payer: Self-pay

## 2019-10-06 ENCOUNTER — Telehealth: Payer: Self-pay | Admitting: Certified Nurse Midwife

## 2019-10-06 DIAGNOSIS — Z20822 Contact with and (suspected) exposure to covid-19: Secondary | ICD-10-CM

## 2019-10-06 NOTE — Telephone Encounter (Signed)
Telephone call in response to patient's mychart message asking to speak. Patient reports being sent home from work [employer]) on 10/3 for a fever of 102. She reports having a HA that day but denies runny nose, sore throat, body aches or chills.   Was instructed that day to go be screened for COVID19 but patient reports she missed that appointment and has not been screened. She denies having a fever now.   Instructed patient to go to drive through testing across the street from the office for screening and to self quarantine until results are back- patient verbalizes understanding and plans to go this morning to get tested.   Lajean Manes, CNM 10/06/19, 9:42 AM

## 2019-10-07 ENCOUNTER — Other Ambulatory Visit (HOSPITAL_COMMUNITY): Payer: Medicaid Other

## 2019-10-07 ENCOUNTER — Telehealth: Payer: Self-pay

## 2019-10-07 NOTE — Telephone Encounter (Signed)
Pt called and asked to speak directly to [place]. I advised pt that Verdene Lennert was not in the office currently but that I would gladly take a message for her. Pt then became irate and stated that she is not returning to our office for care. She states she is upset because [place] put a note in her chart advising that the patient get tested for COVID-19 and quarantine based off of symptoms that the patient had on 10/3 of fever of 102 and HA. Pt reports she tried to go to her [place] appt this morning and they turned her away based off of seeing this note. Pt is very unhappy and inconsolable and hung up the phone.

## 2019-10-07 NOTE — Telephone Encounter (Signed)
Pt called and wanted to speak to Kristi Roth, I advised patient that Verdene Lennert was not in the office and asked if I could take a message for her. Pt then proceeded to become irate over the phone and say that she is not returnings

## 2019-10-08 LAB — NOVEL CORONAVIRUS, NAA: SARS-CoV-2, NAA: NOT DETECTED

## 2019-10-21 ENCOUNTER — Encounter: Payer: Medicaid Other | Admitting: Obstetrics and Gynecology

## 2019-10-27 ENCOUNTER — Encounter (HOSPITAL_COMMUNITY): Payer: Self-pay

## 2019-10-27 ENCOUNTER — Other Ambulatory Visit: Payer: Self-pay

## 2019-10-27 ENCOUNTER — Inpatient Hospital Stay (HOSPITAL_COMMUNITY)
Admission: AD | Admit: 2019-10-27 | Discharge: 2019-10-27 | Disposition: A | Discharge status: Home / Self Care | Payer: Medicaid Other | Attending: Obstetrics and Gynecology | Admitting: Obstetrics and Gynecology

## 2019-10-27 DIAGNOSIS — Z3A22 22 weeks gestation of pregnancy: Secondary | ICD-10-CM | POA: Diagnosis not present

## 2019-10-27 DIAGNOSIS — Z01812 Encounter for preprocedural laboratory examination: Secondary | ICD-10-CM | POA: Insufficient documentation

## 2019-10-27 DIAGNOSIS — R079 Chest pain, unspecified: Secondary | ICD-10-CM | POA: Diagnosis not present

## 2019-10-27 DIAGNOSIS — R05 Cough: Secondary | ICD-10-CM | POA: Diagnosis not present

## 2019-10-27 DIAGNOSIS — Z79899 Other long term (current) drug therapy: Secondary | ICD-10-CM | POA: Insufficient documentation

## 2019-10-27 DIAGNOSIS — R8271 Bacteriuria: Secondary | ICD-10-CM

## 2019-10-27 DIAGNOSIS — Z20828 Contact with and (suspected) exposure to other viral communicable diseases: Secondary | ICD-10-CM | POA: Insufficient documentation

## 2019-10-27 DIAGNOSIS — O26892 Other specified pregnancy related conditions, second trimester: Secondary | ICD-10-CM | POA: Diagnosis not present

## 2019-10-27 DIAGNOSIS — Z3400 Encounter for supervision of normal first pregnancy, unspecified trimester: Secondary | ICD-10-CM

## 2019-10-27 DIAGNOSIS — M25559 Pain in unspecified hip: Secondary | ICD-10-CM | POA: Diagnosis not present

## 2019-10-27 DIAGNOSIS — R059 Cough, unspecified: Secondary | ICD-10-CM

## 2019-10-27 LAB — URINALYSIS, ROUTINE W REFLEX MICROSCOPIC
Bacteria, UA: NONE SEEN
Bilirubin Urine: NEGATIVE
Glucose, UA: NEGATIVE mg/dL
Hgb urine dipstick: NEGATIVE
Ketones, ur: NEGATIVE mg/dL
Nitrite: NEGATIVE
Protein, ur: NEGATIVE mg/dL
Specific Gravity, Urine: 1.016 (ref 1.005–1.030)
pH: 6 (ref 5.0–8.0)

## 2019-10-27 LAB — SARS CORONAVIRUS 2 (TAT 6-24 HRS): SARS Coronavirus 2: NEGATIVE

## 2019-10-27 NOTE — MAU Note (Signed)
Pt states she has cough, runny nose, chest pain with coughing, sneezing. Had sore throat previously but not not today. Thinks she has a cold. Was tested for Covid-19 earlier this month because she said she wanted to be cautious since she works at at [employer]. She did not have any symptoms then.

## 2019-10-27 NOTE — MAU Provider Note (Signed)
History     CSN: [id number]  Arrival date and time: 10/27/19 1225   First Provider Initiated Contact with Patient 10/27/19 1310      Chief Complaint  Patient presents with  . Cough  . Chest Pain  . Hip Pain   HPI  Kristi Roth is a 23 y.o. G1P0 at 58w0dwho presents to MAU today with complaint of cough and chest pain x 3 days. The chest pain occurs with coughing only and then resolves. She states that she has coughed up some yellow mucous and blood in it, but feels that is improving. She denies true fever, but said her mom told her she felt like she had a low grade temperature. She has had congestion and rhinorrhea. She reports normal fetal movement and denies vaginal bleeding, LOF or contractions. She works at [employer] so is in contact with the public regularly. She had a negative COVID test 3 weeks ago.   OB History    Gravida  1   Para      Term      Preterm      AB      Living        SAB      TAB      Ectopic      Multiple      Live Births              Past Medical History:  Diagnosis Date  . Chlamydia 07/2015  . Gonorrhea 02/2018    Past Surgical History:  Procedure Laterality Date  . NO PAST SURGERIES      No family history on file.  Social History   Tobacco Use  . Smoking status: Never Smoker  . Smokeless tobacco: Never Used  Substance Use Topics  . Alcohol use: No  . Drug use: No    Allergies:  Allergies  Allergen Reactions  . Latex Dermatitis    Medications Prior to Admission  Medication Sig Dispense Refill Last Dose  . Blood Pressure Monitor KIT 1 Device by Does not apply route once a week. To be monitored Regularly at home. Dx Code Z34.0 1 kit 0   . docusate sodium (COLACE) 100 MG capsule Take 1 capsule (100 mg total) by mouth 2 (two) times daily as needed. (Patient not taking: Reported on 09/23/2019) 30 capsule 2   . metroNIDAZOLE (FLAGYL) 500 MG tablet Take 1 tablet (500 mg total) by mouth 2 (two) times daily.  (Patient not taking: Reported on 08/26/2019) 14 tablet 0   . Misc. Devices MISC Dispense one maternity belt for patient 1 each 0   . Prenatal Vit-Fe Fumarate-FA (PRENATAL MULTIVITAMIN) TABS tablet Take 1 tablet by mouth daily at 12 noon.     . Prenatal Vit-Fe Phos-FA-Omega (VITAFOL GUMMIES) 3.33-0.333-34.8 MG CHEW Chew 3 tablets by mouth daily. (Patient not taking: Reported on 09/23/2019) 90 tablet 3     Review of Systems  Constitutional: Negative for fever.  HENT: Positive for congestion, rhinorrhea and sinus pressure.   Respiratory: Positive for cough and shortness of breath. Negative for wheezing.   Gastrointestinal: Negative for abdominal pain, constipation, diarrhea, nausea and vomiting.  Genitourinary: Negative for dysuria, frequency, urgency, vaginal bleeding and vaginal discharge.   Physical Exam   Blood pressure 129/66, pulse (!) 104, temperature 98.2 F (36.8 C), temperature source Oral, resp. rate 18, weight 78 kg, last menstrual period 05/26/2019, SpO2 100 %.  Physical Exam  Nursing note and vitals reviewed. Constitutional:  She is oriented to person, place, and time. She appears well-developed and well-nourished. No distress.  HENT:  Head: Normocephalic and atraumatic.  Eyes: Conjunctivae and EOM are normal.  Neck: Neck supple.  Cardiovascular: Regular rhythm and normal heart sounds. Tachycardia present.  Respiratory: Effort normal and breath sounds normal. No respiratory distress. She has no wheezes.  GI: Soft. She exhibits no distension.  Lymphadenopathy:       Head (right side): No submental, no submandibular and no tonsillar adenopathy present.       Head (left side): No submental, no submandibular and no tonsillar adenopathy present.    She has no cervical adenopathy.  Neurological: She is alert and oriented to person, place, and time.  Skin: Skin is warm and dry. No erythema.  Psychiatric: She has a normal mood and affect.    Results for orders placed or performed  during the hospital encounter of 10/27/19 (from the past 24 hour(s))  Urinalysis, Routine w reflex microscopic     Status: Abnormal   Collection Time: 10/27/19  1:09 PM  Result Value Ref Range   Color, Urine YELLOW YELLOW   APPearance CLEAR CLEAR   Specific Gravity, Urine 1.016 1.005 - 1.030   pH 6.0 5.0 - 8.0   Glucose, UA NEGATIVE NEGATIVE mg/dL   Hgb urine dipstick NEGATIVE NEGATIVE   Bilirubin Urine NEGATIVE NEGATIVE   Ketones, ur NEGATIVE NEGATIVE mg/dL   Protein, ur NEGATIVE NEGATIVE mg/dL   Nitrite NEGATIVE NEGATIVE   Leukocytes,Ua TRACE (A) NEGATIVE   RBC / HPF 0-5 0 - 5 RBC/hpf   WBC, UA 0-5 0 - 5 WBC/hpf   Bacteria, UA NONE SEEN NONE SEEN   Squamous Epithelial / LPF 0-5 0 - 5   Mucus PRESENT     MAU Course  Procedures None  MDM FHR - 170 bpm UA today  Hologic COVID test ordered. Patient advised to quarantine until results are finalized.  Assessment and Plan  A: SIUP at 12w0dCough  P:  Discharge home List of OTC medications safe in pregnancy given  Warning signs for worsening condition discussed Patient advised to follow-up with [name] as scheduled for routine prenatal care  Patient may return to MAU as needed or if her condition were to change or worsen   JKerry Hough PA-C 10/27/2019, 1:40 PM

## 2019-10-27 NOTE — Discharge Instructions (Signed)
You should quarantine until your results come back.   Safe Medications in Pregnancy   Acne:  Benzoyl Peroxide  Salicylic Acid   Backache/Headache:  Tylenol: 2 regular strength every 4 hours OR        2 Extra strength every 6 hours   Colds/Coughs/Allergies:  Benadryl (alcohol free) 25 mg every 6 hours as needed  Breath right strips  Claritin  Cepacol throat lozenges  Chloraseptic throat spray  Cold-Eeze- up to three times per day  Cough drops, alcohol free  Flonase (by prescription only)  Guaifenesin  Mucinex  Robitussin DM (plain only, alcohol free)  Saline nasal spray/drops  Sudafed (pseudoephedrine) & Actifed * use only after [redacted] weeks gestation and if you do not have high blood pressure  Tylenol  Vicks Vaporub  Zinc lozenges  Zyrtec   Constipation:  Colace  Ducolax suppositories  Fleet enema  Glycerin suppositories  Metamucil  Milk of magnesia  Miralax  Senokot  Smooth move tea   Diarrhea:  Kaopectate  Imodium A-D   *NO pepto Bismol   Hemorrhoids:  Anusol  Anusol HC  Preparation H  Tucks   Indigestion:  Tums  Maalox  Mylanta  Zantac  Pepcid   Insomnia:  Benadryl (alcohol free)  every 6 hours as needed  Tylenol PM  Unisom, no Gelcaps   Leg Cramps:  Tums  MagGel   Nausea/Vomiting:  Bonine  Dramamine  Emetrol  Ginger extract  Sea bands  Meclizine  Nausea medication to take during pregnancy:  Unisom (doxylamine succinate 25 mg tablets) Take one tablet daily at bedtime. If symptoms are not adequately controlled, the dose can be increased to a maximum recommended dose of two tablets daily (1/2 tablet in the morning, 1/2 tablet mid-afternoon and one at bedtime).  Vitamin B6  tablets. Take one tablet twice a day (up to 200 mg per day).   Skin Rashes:  Aveeno products  Benadryl cream or  every 6 hours as needed  Calamine Lotion  1% cortisone cream   Yeast infection:  Gyne-lotrimin 7  Monistat 7    **If taking  multiple medications, please check labels to avoid duplicating the same active ingredients  **take medication as directed on the label  ** Do not exceed 4000 mg of tylenol in 24 hours  **Do not take medications that contain aspirin or ibuprofen            Coronavirus (COVID-19) and Pregnancy:  Frequently Asked Questions   How might coronavirus affect my pregnancy? The data for COVID-19 is limited, but we know that women with other coronavirus infections (such as SARS-CoV) did NOT have miscarriage or stillbirth at higher rates than the general population.  On the other hand, we know that having other respiratory viral infections during pregnancy, such as flu, has been associated with problems like low birth weight and preterm birth. Also, having a high fever early in pregnancy may increase the risk of certain birth defects.  Could I transmit coronavirus to my baby during pregnancy or delivery? Among the few case studies of infants born to mothers with COVID-19 published in peer-reviewed literature, none of the infants tested positive for the virus. And there have been no reports of mother-to-baby transmission for other coronaviruses (MERS-CoV and SARS-CoV). Also, there was no virus detected in samples of amniotic fluid or breast milk. But there have been a few reports of newborns as young as a few days old with infection, suggesting that a mother can transmit the infection to  her infant through close contact after delivery.  Is it safe for me to deliver at a hospital where there have been COVID-19 cases? It should be. We know that COVID-19 is a very scary virus. The good news is that hospitals are taking great precautions to keep patients and healthcare providers safe.  According to the CDC guidelines, when a patient is even suspected to have COVID-19, they should be placed in a negative pressure room. (Think of these rooms as vacuums that suck and filter the air so it's safe for the other  people in the hospital.) If there are no rooms available, these patients should be asked to wait at home until they can be accomodated safely. This should make it possible for you to deliver at the hospital without putting you or your baby at risk.  Hospitals are also implementing stricter visiting policies to keep patients safe. It's worth calling your hospital to check if there are any new regulations to be aware of.  What plans should I make now in case the hospital system is overwhelmed when it's time for me to deliver? Every hospital is making different plans for dealing with this scenario. Talk with your doctor or midwife once you're at least [redacted] weeks pregnant. I work in [employer].   I work in [employer]. Should I ask my doctor to excuse me from work until the baby is born? Should I ask my doctor to excuse me from work until the baby is born? What if I work in a school, the travel [place], or some other high-risk setting? Healthcare facilities should take care to limit the exposure of pregnant employees to patients with confirmed or suspected COVID-19, just as they would with other infectious cases. If you continue working, be sure to follow the CDC's risk assessment and infection control guidelines.  If you work in a school, travel [place], or other high-risk setting, talk with your employer about what it's doing to protect employees and minimize infection risks. Wash your hands often.   What if my OB gets COVID-19? If your doctor or midwife tests positive for COVID-19, they will need to be quarantined until they recover and are no longer at risk of transmitting the virus. In this case, you'll be assigned to another OB in your doctor's practice (or you may choose another practitioner yourself). Ask your new OB or your doctor's office if you should self-quarantine or be tested for the virus. (It will depend on when you last saw your provider and when that person tested positive.)  Should we  hold off on trying to conceive because of COVID-19? At this time, there's no reason to hold off on trying to get pregnant, but the data we have is really limited. For example, we don't think the virus causes birth defects or increases your risk of miscarriage. But we don't know for sure whether you could transmit COVID-19 to your baby before or during delivery. We also don't know if the virus lives in semen or can be sexually transmitted.  We have a babymoon scheduled in the next few months - should we cancel? Yes. At this time, the virus has reached more than 140 countries, and there are travel bans to [place], most of [place], and [place]. Places where large numbers of people gather are at highest risk, especially airports and cruise ships.  If you were planning travel in the [place] note that any travel setting increases your risk of exposure, and there are already many places where  everyone is being asked to stay home. To see how the virus is spreading, check The [place] Times map based on CDC data.  For the most current advice to help you avoid exposure, check the CDC's COVID-19 travel page.  Will the hospital separate me from my newborn and keep the baby in quarantine? If you don't have COVID-19 and have not been exposed to the virus, the hospital will not separate you from your newborn. If you do test positive for COVID-19 or have been exposed but have no symptoms, the CDC, [employer], and the Society for Maternal-Fetal Medicine all recommend that you be separated from your baby to decrease the risk of transmission to the baby. This would last until you are no longer at risk of transmitting the virus.  This scenario would, of course, be beyond heartbreaking. Talk to the hospital, your baby's pediatrician, and your family about how to plan for care of your baby in the event that you have to be separated after delivery. And try to make sure you have the emotional  support you would need to endure the sadness and stress of having to potentially wait weeks to meet your newborn.   My hospital is restricting visitors and only allowing one support person. If my support person leaves after the delivery, will they be allowed to come back? Every hospital has different policies. Contact your hospital or labor and delivery unit a week or so before delivery to get the most up-to-date restrictions. In general, if your support person needs to leave, they would be allowed back unless they knew they were exposed to COVID-19 after leaving your company.  My mom was planning to fly here to help me care for my new baby after delivery. Should I tell her not to come? Yes. If your mom is over 60 or has any serious chronic medical conditions (such as heart disease, lung disease, or diabetes), she is at higher risk of serious illness from COVID-19 and should avoid air travel. And remember that any travel setting increases a person's risk of exposure. So, it may be risky to have her around the baby after she has been traveling. For the most current advice on traveling, check the CDC's COVID-19 travel page.

## 2019-10-27 NOTE — MAU Note (Signed)
Having chest pain, started 3 days ago, thinks it because she is coughing so hard. Pain starts with coughing, then lingers about 61min.  Then subsides. 3 days she was coughing up "little blood mucous balls", today just yellow/green mucous, no blood.having some pain in her hips and around to her belly. Denies fever.

## 2019-11-01 ENCOUNTER — Encounter: Payer: Medicaid Other | Admitting: Obstetrics

## 2019-11-04 ENCOUNTER — Encounter: Payer: Medicaid Other | Admitting: Obstetrics

## 2019-11-08 ENCOUNTER — Encounter: Payer: Medicaid Other | Admitting: Advanced Practice Midwife

## 2019-11-11 ENCOUNTER — Encounter: Payer: Medicaid Other | Admitting: Obstetrics

## 2019-11-11 ENCOUNTER — Ambulatory Visit (HOSPITAL_COMMUNITY)
Admission: RE | Admit: 2019-11-11 | Discharge: 2019-11-11 | Disposition: A | Discharge status: Home / Self Care | Payer: Medicaid Other | Source: Ambulatory Visit | Attending: Obstetrics and Gynecology | Admitting: Obstetrics and Gynecology

## 2019-11-11 ENCOUNTER — Other Ambulatory Visit: Payer: Self-pay

## 2019-11-11 DIAGNOSIS — Z3400 Encounter for supervision of normal first pregnancy, unspecified trimester: Secondary | ICD-10-CM | POA: Diagnosis present

## 2019-11-11 DIAGNOSIS — Z363 Encounter for antenatal screening for malformations: Secondary | ICD-10-CM

## 2019-11-11 DIAGNOSIS — Z3A24 24 weeks gestation of pregnancy: Secondary | ICD-10-CM | POA: Diagnosis not present

## 2019-11-17 ENCOUNTER — Other Ambulatory Visit: Payer: Self-pay

## 2019-11-17 ENCOUNTER — Encounter: Payer: Self-pay | Admitting: Obstetrics

## 2019-11-17 ENCOUNTER — Ambulatory Visit (INDEPENDENT_AMBULATORY_CARE_PROVIDER_SITE_OTHER): Payer: Medicaid Other | Admitting: Obstetrics

## 2019-11-17 VITALS — BP 126/71 | HR 100 | Wt 180.0 lb

## 2019-11-17 DIAGNOSIS — Z789 Other specified health status: Secondary | ICD-10-CM

## 2019-11-17 DIAGNOSIS — Z3402 Encounter for supervision of normal first pregnancy, second trimester: Secondary | ICD-10-CM

## 2019-11-17 DIAGNOSIS — Z3A25 25 weeks gestation of pregnancy: Secondary | ICD-10-CM

## 2019-11-17 DIAGNOSIS — Z3400 Encounter for supervision of normal first pregnancy, unspecified trimester: Secondary | ICD-10-CM

## 2019-11-17 LAB — POCT URINALYSIS DIPSTICK
Bilirubin, UA: NEGATIVE
Blood, UA: NEGATIVE
Glucose, UA: NEGATIVE
Ketones, UA: POSITIVE
Nitrite, UA: NEGATIVE
Protein, UA: POSITIVE — AB
Spec Grav, UA: 1.015 (ref 1.010–1.025)
Urobilinogen, UA: 0.2 E.U./dL
pH, UA: 5 (ref 5.0–8.0)

## 2019-11-17 NOTE — Progress Notes (Signed)
Subjective:  Kristi Roth is a 23 y.o. G1P0 at [redacted]w[redacted]d being seen today for ongoing prenatal care.  She is currently monitored for the following issues for this low-risk pregnancy and has Supervision of normal first pregnancy, antepartum and GBS bacteriuria on their problem list.  Patient reports no complaints.  Contractions: Not present. Vag. Bleeding: None.  Movement: Present. Denies leaking of fluid.   The following portions of the patient's history were reviewed and updated as appropriate: allergies, current medications, past family history, past medical history, past social history, past surgical history and problem list. Problem list updated.  Objective:   Vitals:   11/17/19 1003  BP: 126/71  Pulse: 100  Weight: 180 lb (81.6 kg)    Fetal Status:     Movement: Present     General:  Alert, oriented and cooperative. Patient is in no acute distress.  Skin: Skin is warm and dry. No rash noted.   Cardiovascular: Normal heart rate noted  Respiratory: Normal respiratory effort, no problems with respiration noted  Abdomen: Soft, gravid, appropriate for gestational age. Pain/Pressure: Absent     Pelvic:  Cervical exam deferred        Extremities: Normal range of motion.  Edema: Trace  Mental Status: Normal mood and affect. Normal behavior. Normal judgment and thought content.   Urinalysis:      Assessment and Plan:  Pregnancy: G1P0 at [redacted]w[redacted]d  1. Supervision of normal first pregnancy, antepartum Rx: - POCT Urinalysis Dipstick - Urine Culture  2. Language barrier affecting health care - interpreter present  Preterm labor symptoms and general obstetric precautions including but not limited to vaginal bleeding, contractions, leaking of fluid and fetal movement were reviewed in detail with the patient. Please refer to After Visit Summary for other counseling recommendations.   Return for ROB, 2 hour OGTT.   Shelly Bombard, MD  11/17/2019

## 2019-11-17 NOTE — Progress Notes (Signed)
ROB.  C/o uncomfortable to sleep, urinary frequency, abdominal pain that radiates to her back 5-7/10 x 6 day. Denies fever, chills, NV.

## 2019-11-19 LAB — URINE CULTURE

## 2019-12-08 ENCOUNTER — Ambulatory Visit (INDEPENDENT_AMBULATORY_CARE_PROVIDER_SITE_OTHER): Payer: Medicaid Other | Admitting: Obstetrics and Gynecology

## 2019-12-08 ENCOUNTER — Other Ambulatory Visit: Payer: Self-pay

## 2019-12-08 ENCOUNTER — Other Ambulatory Visit: Payer: Medicaid Other

## 2019-12-08 ENCOUNTER — Encounter: Payer: Self-pay | Admitting: Obstetrics and Gynecology

## 2019-12-08 VITALS — BP 122/73 | HR 91 | Wt 187.0 lb

## 2019-12-08 DIAGNOSIS — Z3A28 28 weeks gestation of pregnancy: Secondary | ICD-10-CM

## 2019-12-08 DIAGNOSIS — Z23 Encounter for immunization: Secondary | ICD-10-CM

## 2019-12-08 DIAGNOSIS — Z3403 Encounter for supervision of normal first pregnancy, third trimester: Secondary | ICD-10-CM

## 2019-12-08 DIAGNOSIS — Z3400 Encounter for supervision of normal first pregnancy, unspecified trimester: Secondary | ICD-10-CM

## 2019-12-08 DIAGNOSIS — R8271 Bacteriuria: Secondary | ICD-10-CM

## 2019-12-08 NOTE — Patient Instructions (Signed)
Third Trimester of Pregnancy The third trimester is from week 28 through week 40 (months 7 through 9). The third trimester is a time when the unborn baby (fetus) is growing rapidly. At the end of the ninth month, the fetus is about 20 inches in length and weighs 6-10 pounds. Body changes during your third trimester Your body will continue to go through many changes during pregnancy. The changes vary from woman to woman. During the third trimester:  Your weight will continue to increase. You can expect to gain 25-35 pounds (11-16 kg) by the end of the pregnancy.  You may begin to get stretch marks on your hips, abdomen, and breasts.  You may urinate more often because the fetus is moving lower into your pelvis and pressing on your bladder.  You may develop or continue to have heartburn. This is caused by increased hormones that slow down muscles in the digestive tract.  You may develop or continue to have constipation because increased hormones slow digestion and cause the muscles that push waste through your intestines to relax.  You may develop hemorrhoids. These are swollen veins (varicose veins) in the rectum that can itch or be painful.  You may develop swollen, bulging veins (varicose veins) in your legs.  You may have increased body aches in the pelvis, back, or thighs. This is due to weight gain and increased hormones that are relaxing your joints.  You may have changes in your hair. These can include thickening of your hair, rapid growth, and changes in texture. Some women also have hair loss during or after pregnancy, or hair that feels dry or thin. Your hair will most likely return to normal after your baby is born.  Your breasts will continue to grow and they will continue to become tender. A yellow fluid (colostrum) may leak from your breasts. This is the first milk you are producing for your baby.  Your belly button may stick out.  You may notice more swelling in your hands,  face, or ankles.  You may have increased tingling or numbness in your hands, arms, and legs. The skin on your belly may also feel numb.  You may feel short of breath because of your expanding uterus.  You may have more problems sleeping. This can be caused by the size of your belly, increased need to urinate, and an increase in your body's metabolism.  You may notice the fetus "dropping," or moving lower in your abdomen (lightening).  You may have increased vaginal discharge.  You may notice your joints feel loose and you may have pain around your pelvic bone. What to expect at prenatal visits You will have prenatal exams every 2 weeks until week 36. Then you will have weekly prenatal exams. During a routine prenatal visit:  You will be weighed to make sure you and the baby are growing normally.  Your blood pressure will be taken.  Your abdomen will be measured to track your baby's growth.  The fetal heartbeat will be listened to.  Any test results from the previous visit will be discussed.  You may have a cervical check near your due date to see if your cervix has softened or thinned (effaced).  You will be tested for Group B streptococcus. This happens between 35 and 37 weeks. Your health care provider may ask you:  What your birth plan is.  How you are feeling.  If you are feeling the baby move.  If you have had any abnormal   symptoms, such as leaking fluid, bleeding, severe headaches, or abdominal cramping.  If you are using any tobacco products, including cigarettes, chewing tobacco, and electronic cigarettes.  If you have any questions. Other tests or screenings that may be performed during your third trimester include:  Blood tests that check for low iron levels (anemia).  Fetal testing to check the health, activity level, and growth of the fetus. Testing is done if you have certain medical conditions or if there are problems during the pregnancy.  Nonstress test  (NST). This test checks the health of your baby to make sure there are no signs of problems, such as the baby not getting enough oxygen. During this test, a belt is placed around your belly. The baby is made to move, and its heart rate is monitored during movement. What is false labor? False labor is a condition in which you feel small, irregular tightenings of the muscles in the womb (contractions) that usually go away with rest, changing position, or drinking water. These are called Braxton Hicks contractions. Contractions may last for hours, days, or even weeks before true labor sets in. If contractions come at regular intervals, become more frequent, increase in intensity, or become painful, you should see your health care provider. What are the signs of labor?  Abdominal cramps.  Regular contractions that start at 10 minutes apart and become stronger and more frequent with time.  Contractions that start on the top of the uterus and spread down to the lower abdomen and back.  Increased pelvic pressure and dull back pain.  A watery or bloody mucus discharge that comes from the vagina.  Leaking of amniotic fluid. This is also known as your "water breaking." It could be a slow trickle or a gush. Let your health care provider know if it has a color or strange odor. If you have any of these signs, call your health care provider right away, even if it is before your due date. Follow these instructions at home: Medicines  Follow your health care provider's instructions regarding medicine use. Specific medicines may be either safe or unsafe to take during pregnancy.  Take a prenatal vitamin that contains at least 600 micrograms (mcg) of folic acid.  If you develop constipation, try taking a stool softener if your health care provider approves. Eating and drinking   Eat a balanced diet that includes fresh fruits and vegetables, whole grains, good sources of protein such as meat, eggs, or tofu,  and low-fat dairy. Your health care provider will help you determine the amount of weight gain that is right for you.  Avoid raw meat and uncooked cheese. These carry germs that can cause birth defects in the baby.  If you have low calcium intake from food, talk to your health care provider about whether you should take a daily calcium supplement.  Eat four or five small meals rather than three large meals a day.  Limit foods that are high in fat and processed sugars, such as fried and sweet foods.  To prevent constipation: ? Drink enough fluid to keep your urine clear or pale yellow. ? Eat foods that are high in fiber, such as fresh fruits and vegetables, whole grains, and beans. Activity  Exercise only as directed by your health care provider. Most women can continue their usual exercise routine during pregnancy. Try to exercise for 30 minutes at least 5 days a week. Stop exercising if you experience uterine contractions.  Avoid heavy lifting.  Do   not exercise in extreme heat or humidity, or at high altitudes.  Wear low-heel, comfortable shoes.  Practice good posture.  You may continue to have sex unless your health care provider tells you otherwise. Relieving pain and discomfort  Take frequent breaks and rest with your legs elevated if you have leg cramps or low back pain.  Take warm sitz baths to soothe any pain or discomfort caused by hemorrhoids. Use hemorrhoid cream if your health care provider approves.  Wear a good support bra to prevent discomfort from breast tenderness.  If you develop varicose veins: ? Wear support pantyhose or compression stockings as told by your healthcare provider. ? Elevate your feet for 15 minutes, 3-4 times a day. Prenatal care  Write down your questions. Take them to your prenatal visits.  Keep all your prenatal visits as told by your health care provider. This is important. Safety  Wear your seat belt at all times when driving.  Make  a list of emergency phone numbers, including numbers for family, friends, the hospital, and police and fire departments. General instructions  Avoid cat litter boxes and soil used by cats. These carry germs that can cause birth defects in the baby. If you have a cat, ask someone to clean the litter box for you.  Do not travel far distances unless it is absolutely necessary and only with the approval of your health care provider.  Do not use hot tubs, steam rooms, or saunas.  Do not drink alcohol.  Do not use any products that contain nicotine or tobacco, such as cigarettes and e-cigarettes. If you need help quitting, ask your health care provider.  Do not use any medicinal herbs or unprescribed drugs. These chemicals affect the formation and growth of the baby.  Do not douche or use tampons or scented sanitary pads.  Do not cross your legs for long periods of time.  To prepare for the arrival of your baby: ? Take prenatal classes to understand, practice, and ask questions about labor and delivery. ? Make a trial run to the hospital. ? Visit the hospital and tour the maternity area. ? Arrange for maternity or paternity leave through employers. ? Arrange for family and friends to take care of pets while you are in the hospital. ? Purchase a rear-facing car seat and make sure you know how to install it in your car. ? Pack your hospital bag. ? Prepare the baby's nursery. Make sure to remove all pillows and stuffed animals from the baby's crib to prevent suffocation.  Visit your dentist if you have not gone during your pregnancy. Use a soft toothbrush to brush your teeth and be gentle when you floss. Contact a health care provider if:  You are unsure if you are in labor or if your water has broken.  You become dizzy.  You have mild pelvic cramps, pelvic pressure, or nagging pain in your abdominal area.  You have lower back pain.  You have persistent nausea, vomiting, or diarrhea.   You have an unusual or bad smelling vaginal discharge.  You have pain when you urinate. Get help right away if:  Your water breaks before 37 weeks.  You have regular contractions less than 5 minutes apart before 37 weeks.  You have a fever.  You are leaking fluid from your vagina.  You have spotting or bleeding from your vagina.  You have severe abdominal pain or cramping.  You have rapid weight loss or weight gain.  You have   shortness of breath with chest pain.  You notice sudden or extreme swelling of your face, hands, ankles, feet, or legs.  Your baby makes fewer than 10 movements in 2 hours.  You have severe headaches that do not go away when you take medicine.  You have vision changes. Summary  The third trimester is from week 28 through week 40, months 7 through 9. The third trimester is a time when the unborn baby (fetus) is growing rapidly.  During the third trimester, your discomfort may increase as you and your baby continue to gain weight. You may have abdominal, leg, and back pain, sleeping problems, and an increased need to urinate.  During the third trimester your breasts will keep growing and they will continue to become tender. A yellow fluid (colostrum) may leak from your breasts. This is the first milk you are producing for your baby.  False labor is a condition in which you feel small, irregular tightenings of the muscles in the womb (contractions) that eventually go away. These are called Braxton Hicks contractions. Contractions may last for hours, days, or even weeks before true labor sets in.  Signs of labor can include: abdominal cramps; regular contractions that start at 10 minutes apart and become stronger and more frequent with time; watery or bloody mucus discharge that comes from the vagina; increased pelvic pressure and dull back pain; and leaking of amniotic fluid. This information is not intended to replace advice given to you by your health  care provider. Make sure you discuss any questions you have with your health care provider. Document Released: 12/10/2001 Document Revised: 04/08/2019 Document Reviewed: 01/21/2017 [employer] Patient Education  2020 [employer]  

## 2019-12-08 NOTE — Progress Notes (Signed)
Subjective:  Kristi Roth is a 23 y.o. G1P0 at [redacted]w[redacted]d being seen today for ongoing prenatal care.  She is currently monitored for the following issues for this low-risk pregnancy and has Supervision of normal first pregnancy, antepartum and GBS bacteriuria on their problem list.  Patient reports no complaints.  Contractions: Not present. Vag. Bleeding: None.  Movement: Present. Denies leaking of fluid.   The following portions of the patient's history were reviewed and updated as appropriate: allergies, current medications, past family history, past medical history, past social history, past surgical history and problem list. Problem list updated.  Objective:   Vitals:   12/08/19 0941  BP: 122/73  Pulse: 91  Weight: 187 lb (84.8 kg)    Fetal Status:     Movement: Present     General:  Alert, oriented and cooperative. Patient is in no acute distress.  Skin: Skin is warm and dry. No rash noted.   Cardiovascular: Normal heart rate noted  Respiratory: Normal respiratory effort, no problems with respiration noted  Abdomen: Soft, gravid, appropriate for gestational age. Pain/Pressure: Absent     Pelvic:  Cervical exam deferred        Extremities: Normal range of motion.  Edema: Trace  Mental Status: Normal mood and affect. Normal behavior. Normal judgment and thought content.   Urinalysis:      Assessment and Plan:  Pregnancy: G1P0 at [redacted]w[redacted]d  1. Supervision of normal first pregnancy, antepartum Stable 28 week labs today Considering Tdap  2. GBS bacteriuria TOC today  Preterm labor symptoms and general obstetric precautions including but not limited to vaginal bleeding, contractions, leaking of fluid and fetal movement were reviewed in detail with the patient. Please refer to After Visit Summary for other counseling recommendations.  Return in about 4 weeks (around 01/05/2020) for OB visit, virtual.   Chancy Milroy, MD

## 2019-12-09 LAB — CBC
Hematocrit: 35.7 % (ref 34.0–46.6)
Hemoglobin: 12.2 g/dL (ref 11.1–15.9)
MCH: 31.4 pg (ref 26.6–33.0)
MCHC: 34.2 g/dL (ref 31.5–35.7)
MCV: 92 fL (ref 79–97)
Platelets: 233 10*3/uL (ref 150–450)
RBC: 3.89 x10E6/uL (ref 3.77–5.28)
RDW: 12 % (ref 11.7–15.4)
WBC: 6.5 10*3/uL (ref 3.4–10.8)

## 2019-12-09 LAB — RPR: RPR Ser Ql: NONREACTIVE

## 2019-12-09 LAB — GLUCOSE TOLERANCE, 2 HOURS W/ 1HR
Glucose, 1 hour: 101 mg/dL (ref 65–179)
Glucose, 2 hour: 74 mg/dL (ref 65–152)
Glucose, Fasting: 69 mg/dL (ref 65–91)

## 2019-12-09 LAB — HIV ANTIBODY (ROUTINE TESTING W REFLEX): HIV Screen 4th Generation wRfx: NONREACTIVE

## 2019-12-10 LAB — URINE CULTURE, OB REFLEX

## 2019-12-10 LAB — CULTURE, OB URINE

## 2019-12-26 ENCOUNTER — Encounter (HOSPITAL_COMMUNITY): Payer: Self-pay | Admitting: Family Medicine

## 2019-12-26 ENCOUNTER — Inpatient Hospital Stay (HOSPITAL_COMMUNITY)
Admission: AD | Admit: 2019-12-26 | Discharge: 2019-12-26 | Disposition: A | Discharge status: Home / Self Care | Payer: Medicaid Other | Attending: Family Medicine | Admitting: Family Medicine

## 2019-12-26 ENCOUNTER — Other Ambulatory Visit: Payer: Self-pay

## 2019-12-26 DIAGNOSIS — O26893 Other specified pregnancy related conditions, third trimester: Secondary | ICD-10-CM | POA: Insufficient documentation

## 2019-12-26 DIAGNOSIS — R079 Chest pain, unspecified: Secondary | ICD-10-CM | POA: Insufficient documentation

## 2019-12-26 DIAGNOSIS — R55 Syncope and collapse: Secondary | ICD-10-CM

## 2019-12-26 DIAGNOSIS — Z0371 Encounter for suspected problem with amniotic cavity and membrane ruled out: Secondary | ICD-10-CM

## 2019-12-26 DIAGNOSIS — Z3A30 30 weeks gestation of pregnancy: Secondary | ICD-10-CM

## 2019-12-26 LAB — CBC
HCT: 34.9 % — ABNORMAL LOW (ref 36.0–46.0)
Hemoglobin: 12 g/dL (ref 12.0–15.0)
MCH: 30.8 pg (ref 26.0–34.0)
MCHC: 34.4 g/dL (ref 30.0–36.0)
MCV: 89.7 fL (ref 80.0–100.0)
Platelets: 224 10*3/uL (ref 150–400)
RBC: 3.89 MIL/uL (ref 3.87–5.11)
RDW: 12 % (ref 11.5–15.5)
WBC: 7.2 10*3/uL (ref 4.0–10.5)
nRBC: 0 % (ref 0.0–0.2)

## 2019-12-26 LAB — BASIC METABOLIC PANEL
Anion gap: 8 (ref 5–15)
BUN: 6 mg/dL (ref 6–20)
CO2: 22 mmol/L (ref 22–32)
Calcium: 8.8 mg/dL — ABNORMAL LOW (ref 8.9–10.3)
Chloride: 103 mmol/L (ref 98–111)
Creatinine, Ser: 0.57 mg/dL (ref 0.44–1.00)
GFR calc Af Amer: >60 mL/min (ref >60)
GFR calc non Af Amer: >60 mL/min (ref >60)
Glucose, Bld: 73 mg/dL (ref 70–99)
Potassium: 3.7 mmol/L (ref 3.5–5.1)
Sodium: 133 mmol/L — ABNORMAL LOW (ref 135–145)

## 2019-12-26 LAB — URINALYSIS, ROUTINE W REFLEX MICROSCOPIC
Bilirubin Urine: NEGATIVE
Glucose, UA: NEGATIVE mg/dL
Hgb urine dipstick: NEGATIVE
Ketones, ur: NEGATIVE mg/dL
Nitrite: NEGATIVE
Protein, ur: NEGATIVE mg/dL
Specific Gravity, Urine: 1.008 (ref 1.005–1.030)
pH: 8 (ref 5.0–8.0)

## 2019-12-26 LAB — AMNISURE RUPTURE OF MEMBRANE (ROM) NOT AT ARMC: Amnisure ROM: NEGATIVE
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
## 2019-12-26 NOTE — Discharge Instructions (Signed)
Warning Signs During Pregnancy A pregnancy lasts about 40 weeks, starting from the first day of your last period until the baby is born. Pregnancy is divided into three phases called trimesters.  The first trimester refers to week 1 through week 13 of pregnancy.  The second trimester is the start of week 14 through the end of week 27.  The third trimester is the start of week 28 until you deliver your baby. During each trimester of pregnancy, certain signs and symptoms may indicate a problem. Talk with your health care provider about your current health and any medical conditions you have. Make sure you know the symptoms that you should watch for and report. How does this affect me?  Warning signs in the first trimester While some changes during the first trimester may be uncomfortable, most do not represent a serious problem. Let your health care provider know if you have any of the following warning signs in the first trimester:  You cannot eat or drink without vomiting, and this lasts for longer than a day.  You have vaginal bleeding or spotting along with menstrual-like cramping.  You have diarrhea for longer than a day.  You have a fever or other signs of infection, such as: ? Pain or burning when you urinate. ? Foul smelling or thick or yellowish vaginal discharge. Warning signs in the second trimester As your baby grows and changes during the second trimester, there are additional signs and symptoms that may indicate a problem. These include:  Signs and symptoms of infection, including a fever.  Signs or symptoms of a miscarriage or preterm labor, such as regular contractions, menstrual-like cramping, or lower abdominal pain.  Bloody or watery vaginal discharge or obvious vaginal bleeding.  Feeling like your heart is pounding.  Having trouble breathing.  Nausea, vomiting, or diarrhea that lasts for longer than a day.  Craving non-food items, such as clay, chalk, or dirt.  This may be a sign of a very treatable medical condition called pica. Later in your second trimester, watch for signs and symptoms of a serious medical condition called preeclampsia.These include:  Changes in your vision.  A severe headache that does not go away.  Nausea and vomiting. It is also important to notice if your baby stops moving or moves less than usual during this time. Warning signs in the third trimester As you approach the third trimester, your baby is growing and your body is preparing for the birth of your baby. In your third trimester, be sure to let your health care provider know if:  You have signs and symptoms of infection, including a fever.  You have vaginal bleeding.  You notice that your baby is moving less than usual or is not moving.  You have nausea, vomiting, or diarrhea that lasts for longer than a day.  You have a severe headache that does not go away.  You have vision changes, including seeing spots or having blurry or double vision.  You have increased swelling in your hands or face. How does this affect my baby? Throughout your pregnancy, always report any of the warning signs of a problem to your health care provider. This can help prevent complications that may affect your baby, including:  Increased risk for premature birth.  Infection that may be transmitted to your baby.  Increased risk for stillbirth. Contact a health care provider if:  You have any of the warning signs of a problem for the current trimester of your pregnancy.    Any of the following apply to you during any trimester of pregnancy: ? You have strong emotions, such as sadness or anxiety, that interfere with work or personal relationships. ? You feel unsafe in your home and need help finding a safe place to live. ? You are using tobacco products, alcohol, or drugs and you need help to stop. Get help right away if: You have signs or symptoms of labor before 37 weeks of  pregnancy. These include:  Contractions that are 5 minutes or less apart, or that increase in frequency, intensity, or length.  Sudden, sharp abdominal pain or low back pain.  Uncontrolled gush or trickle of fluid from your vagina. Summary  A pregnancy lasts about 40 weeks, starting from the first day of your last period until the baby is born. Pregnancy is divided into three phases called trimesters. Each trimester has warning signs to watch for.  Always report any warning signs to your health care provider in order to prevent complications that may affect both you and your baby.  Talk with your health care provider about your current health and any medical conditions you have. Make sure you know the symptoms that you should watch for and report. This information is not intended to replace advice given to you by your health care provider. Make sure you discuss any questions you have with your health care provider. Document Released: 10/02/2017 Document Revised: 04/06/2019 Document Reviewed: 10/02/2017 Elsevier Patient Education  2020 [place]. Fetal Movement Counts Patient Name: ________________________________________________ Patient Due Date: ____________________ What is a fetal movement count?  A fetal movement count is the number of times that you feel your baby move during a certain amount of time. This may also be called a fetal kick count. A fetal movement count is recommended for every pregnant woman. You may be asked to start counting fetal movements as early as week 28 of your pregnancy. Pay attention to when your baby is most active. You may notice your baby's sleep and wake cycles. You may also notice things that make your baby move more. You should do a fetal movement count:  When your baby is normally most active.  At the same time each day. A good time to count movements is while you are resting, after having something to eat and drink. How do I count fetal  movements? 1. Find a quiet, comfortable area. Sit, or lie down on your side. 2. Write down the date, the start time and stop time, and the number of movements that you felt between those two times. Take this information with you to your health care visits. 3. For 2 hours, count kicks, flutters, swishes, rolls, and jabs. You should feel at least 10 movements during 2 hours. 4. You may stop counting after you have felt 10 movements. 5. If you do not feel 10 movements in 2 hours, have something to eat and drink. Then, keep resting and counting for 1 hour. If you feel at least 4 movements during that hour, you may stop counting. Contact a health care provider if:  You feel fewer than 4 movements in 2 hours.  Your baby is not moving like he or she usually does. Date: ____________ Start time: ____________ Stop time: ____________ Movements: ____________ Date: ____________ Start time: ____________ Stop time: ____________ Movements: ____________ Date: ____________ Start time: ____________ Stop time: ____________ Movements: ____________ Date: ____________ Start time: ____________ Stop time: ____________ Movements: ____________ Date: ____________ Start time: ____________ Stop time: ____________ Movements: ____________ Date: ____________  Start time: ____________ Stop time: ____________ Movements: ____________ Date: ____________ Start time: ____________ Stop time: ____________ Movements: ____________ Date: ____________ Start time: ____________ Stop time: ____________ Movements: ____________ Date: ____________ Start time: ____________ Stop time: ____________ Movements: ____________ This information is not intended to replace advice given to you by your health care provider. Make sure you discuss any questions you have with your health care provider. Document Released: 01/15/2007 Document Revised: 01/05/2019 Document Reviewed: 01/25/2016 Elsevier Patient Education  2020 [employer].
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
## 2019-12-26 NOTE — Progress Notes (Signed)
Received new order per Robyne Askew, NP that pt can be removed from fetal monitoring.

## 2019-12-31 NOTE — L&D Delivery Note (Addendum)
Delivery Note At 5:44 AM a viable female was delivered via Vaginal, Spontaneous (Presentation: Occiput Anterior).  APGAR: 8, 9; weight 7 lb 12.3 oz (3524 g).   Placenta status: Spontaneous, Intact.  Cord: 3 vessels with the following complications: None.  Cord pH: N/A  Anesthesia: Epidural Episiotomy: None Lacerations: 3B  Suture Repair: 2.0 and 3.0 Vicryl Est. Blood Loss (mL): 750  Mom to postpartum.  Baby to Couplet care / Skin to Skin.  Dollene Cleveland 02/23/2020, 7:23 AM   OB FELLOW ATTESTATION  I was present, gloved, and supervising throughout delivery and have edited the above note to reflect any changes or updates. I personally repaired 3rd degree laceration. Patient was also given 1 dose of methergine for ongoing trickling after delivery.   Zack Seal, MD/[facility] OB Fellow  02/23/2020, 8:27 AM

## 2020-01-06 ENCOUNTER — Encounter (HOSPITAL_COMMUNITY): Payer: Self-pay | Admitting: Family Medicine

## 2020-01-06 ENCOUNTER — Inpatient Hospital Stay (HOSPITAL_COMMUNITY)
Admission: AD | Admit: 2020-01-06 | Discharge: 2020-01-06 | Disposition: A | Discharge status: Home / Self Care | Payer: Medicaid Other | Attending: Family Medicine | Admitting: Family Medicine

## 2020-01-06 ENCOUNTER — Other Ambulatory Visit: Payer: Self-pay

## 2020-01-06 DIAGNOSIS — R42 Dizziness and giddiness: Secondary | ICD-10-CM | POA: Diagnosis not present

## 2020-01-06 DIAGNOSIS — Z3689 Encounter for other specified antenatal screening: Secondary | ICD-10-CM

## 2020-01-06 DIAGNOSIS — M7918 Myalgia, other site: Secondary | ICD-10-CM

## 2020-01-06 DIAGNOSIS — R109 Unspecified abdominal pain: Secondary | ICD-10-CM | POA: Diagnosis not present

## 2020-01-06 DIAGNOSIS — O26853 Spotting complicating pregnancy, third trimester: Secondary | ICD-10-CM | POA: Insufficient documentation

## 2020-01-06 DIAGNOSIS — O26893 Other specified pregnancy related conditions, third trimester: Secondary | ICD-10-CM | POA: Diagnosis not present

## 2020-01-06 DIAGNOSIS — Z3A32 32 weeks gestation of pregnancy: Secondary | ICD-10-CM

## 2020-01-06 LAB — WET PREP, GENITAL
Clue Cells Wet Prep HPF POC: NONE SEEN
Sperm: NONE SEEN
Trich, Wet Prep: NONE SEEN
Yeast Wet Prep HPF POC: NONE SEEN

## 2020-01-06 LAB — CBC
HCT: 35.7 % — ABNORMAL LOW (ref 36.0–46.0)
Hemoglobin: 12.2 g/dL (ref 12.0–15.0)
MCH: 30.9 pg (ref 26.0–34.0)
MCHC: 34.2 g/dL (ref 30.0–36.0)
MCV: 90.4 fL (ref 80.0–100.0)
Platelets: 242 10*3/uL (ref 150–400)
RBC: 3.95 MIL/uL (ref 3.87–5.11)
RDW: 11.9 % (ref 11.5–15.5)
WBC: 8.9 10*3/uL (ref 4.0–10.5)
nRBC: 0 % (ref 0.0–0.2)

## 2020-01-06 LAB — URINALYSIS, ROUTINE W REFLEX MICROSCOPIC
Bilirubin Urine: NEGATIVE
Glucose, UA: NEGATIVE mg/dL
Hgb urine dipstick: NEGATIVE
Ketones, ur: NEGATIVE mg/dL
Nitrite: NEGATIVE
Protein, ur: NEGATIVE mg/dL
Specific Gravity, Urine: 1.016 (ref 1.005–1.030)
pH: 6 (ref 5.0–8.0)

## 2020-01-06 MED ORDER — ACETAMINOPHEN 500 MG PO TABS: 1000.0000 mg | ORAL_TABLET | Freq: Four times a day (QID) | ORAL | 0 refills | Status: DC | PRN

## 2020-01-06 MED ORDER — ACETAMINOPHEN 500 MG PO TABS
1000.0000 mg | ORAL_TABLET | Freq: Four times a day (QID) | ORAL | Status: DC | PRN
  Administered 2020-01-06: 1000 mg via ORAL
  Filled 2020-01-06: qty 2

## 2020-01-06 NOTE — MAU Note (Signed)
Pt states she has been having upper left pain "right under breast bone" that she c/o of when she is reaching x 5 days. Has not tried taking anything for pain.  Also, c/o VB x 3days ago that stopped, but started again this morning that was a dark red, but has lightened up since then; denies clots. C/o lightheadedness, states she has been staying hydrated & was able to drive here.

## 2020-01-06 NOTE — Discharge Instructions (Signed)
Abdominal Pain During Pregnancy  Belly (abdominal) pain is common during pregnancy. There are many possible causes. Most of the time, it is not a serious problem. Other times, it can be a sign that something is wrong with the pregnancy. Always tell your doctor if you have belly pain. Follow these instructions at home:  Do not have sex or put anything in your vagina until your pain goes away completely.  Get plenty of rest until your pain gets better.  Drink enough fluid to keep your pee (urine) pale yellow.  Take over-the-counter and prescription medicines only as told by your doctor.  Keep all follow-up visits as told by your doctor. This is important. Contact a doctor if:  Your pain continues or gets worse after resting.  You have lower belly pain that: ? Comes and goes at regular times. ? Spreads to your back. ? Feels like menstrual cramps.  You have pain or burning when you pee (urinate). Get help right away if:  You have a fever or chills.  You have vaginal bleeding.  You are leaking fluid from your vagina.  You are passing tissue from your vagina.  You throw up (vomit) for more than 24 hours.  You have watery poop (diarrhea) for more than 24 hours.  Your baby is moving less than usual.  You feel very weak or faint.  You have shortness of breath.  You have very bad pain in your upper belly. Summary  Belly (abdominal) pain is common during pregnancy. There are many possible causes.  If you have belly pain during pregnancy, tell your doctor right away.  Keep all follow-up visits as told by your doctor. This is important. This information is not intended to replace advice given to you by your health care provider. Make sure you discuss any questions you have with your health care provider. Document Revised: 04/05/2019 Document Reviewed: 03/20/2017 Elsevier Patient Education  2020 Elsevier Inc.  Vaginal Bleeding During Pregnancy, Third Trimester  A small  amount of bleeding (spotting) from the vagina is common during pregnancy. Sometimes the bleeding is normal and is not a problem, and sometimes it is a sign of something serious. Tell your doctor about any bleeding from your vagina right away. Follow these instructions at home: Activity  Follow your doctor's instructions about how active you can be. Your doctor may recommend that you: ? Stay in bed and only get up to use the bathroom. ? Continue light activity.  If needed, make plans for someone to help you with your normal activities.  Ask your doctor if it is safe for you to drive.  Do not lift anything that is heavier than 10 lb (4.5 kg) until your doctor says that this is safe.  Do not have sex or orgasms until your doctor says that this is safe. Medicines  Take over-the-counter and prescription medicines only as told by your doctor.  Do not take aspirin. It can cause bleeding. General instructions  Watch your condition for any changes.  Write down: ? The number of pads you use each day. ? How often you change pads. ? How soaked (saturated) your pads are.  Do not use tampons.  Do not douche.  If you pass any tissue from your vagina, save the tissue to show your doctor.  Keep all follow-up visits as told by your doctor. This is important. Contact a doctor if:  You have vaginal bleeding at any time during pregnancy.  You have cramps.  You have  a fever. Get help right away if:  You have very bad cramps.  You have very bad pain in your back or belly (abdomen).  You have a gush of fluid from your vagina.  You pass large clots or a lot of tissue from your vagina.  Your bleeding gets worse.  You feel light-headed or weak.  You pass out (faint).  Your baby is moving less than usual, or not moving at all. Summary  Tell your doctor about any bleeding from your vagina right away.  Follow instructions from your doctor about how active you can be. You may need  someone to help you with your normal activities. This information is not intended to replace advice given to you by your health care provider. Make sure you discuss any questions you have with your health care provider. Document Revised: 04/06/2019 Document Reviewed: 03/19/2017 Elsevier Patient Education  2020 [place] for Pregnant Women While you are pregnant, your body requires additional nutrition to help support your growing baby. You also have a higher need for some vitamins and minerals, such as folic acid, calcium, iron, and vitamin D. Eating a healthy, well-balanced diet is very important for your health and your baby's health. Your need for extra calories varies for the three 81-month segments of your pregnancy (trimesters). For most women, it is recommended to consume:  150 extra calories a day during the first trimester.  300 extra calories a day during the second trimester.  300 extra calories a day during the third trimester. What are tips for following this plan?   Do not try to lose weight or go on a diet during pregnancy.  Limit your overall intake of foods that have "empty calories." These are foods that have little nutritional value, such as sweets, desserts, candies, and sugar-sweetened beverages.  Eat a variety of foods (especially fruits and vegetables) to get a full range of vitamins and minerals.  Take a prenatal vitamin to help meet your additional vitamin and mineral needs during pregnancy, specifically for folic acid, iron, calcium, and vitamin D.  Remember to stay active. Ask your health care provider what types of exercise and activities are safe for you.  Practice good food safety and cleanliness. Wash your hands before you eat and after you prepare raw meat. Wash all fruits and vegetables well before peeling or eating. Taking these actions can help to prevent food-borne illnesses that can be very dangerous to your baby, such as listeriosis.  Ask your health care provider for more information about listeriosis. What does 150 extra calories look like? Healthy options that provide 150 extra calories each day could be any of the following:  6-8 oz (170-230 g) of plain low-fat yogurt with  cup of berries.  1 apple with 2 teaspoons (11 g) of peanut butter.  Cut-up vegetables with  cup (60 g) of hummus.  8 oz (230 mL) or 1 cup of low-fat chocolate milk.  1 stick of string cheese with 1 medium orange.  1 peanut butter and jelly sandwich that is made with one slice of whole-wheat bread and 1 tsp (5 g) of peanut butter. For 300 extra calories, you could eat two of those healthy options each day. What is a healthy amount of weight to gain? The right amount of weight gain for you is based on your BMI before you became pregnant. If your BMI:  Was less than 18 (underweight), you should gain 28-40 lb (13-18 kg).  Was  18-24.9 (normal), you should gain 25-35 lb (11-16 kg).  Was 25-29.9 (overweight), you should gain 15-25 lb (7-11 kg).  Was 30 or greater (obese), you should gain 11-20 lb (5-9 kg). What if I am having twins or multiples? Generally, if you are carrying twins or multiples:  You may need to eat 300-600 extra calories a day.  The recommended range for total weight gain is 25-54 lb (11-25 kg), depending on your BMI before pregnancy.  Talk with your health care provider to find out about nutritional needs, weight gain, and exercise that is right for you. What foods can I eat?  Fruits All fruits. Eat a variety of colors and types of fruit. Remember to wash your fruits well before peeling or eating. Vegetables All vegetables. Eat a variety of colors and types of vegetables. Remember to wash your vegetables well before peeling or eating. Grains All grains. Choose whole grains, such as whole-wheat bread, oatmeal, or brown rice. Meats and other protein foods Lean meats, including chicken, [place], fish, and lean cuts of  beef, veal, or pork. If you eat fish or seafood, choose options that are higher in omega-3 fatty acids and lower in mercury, such as salmon, herring, mussels, trout, sardines, pollock, shrimp, crab, and lobster. Tofu. Tempeh. Beans. Eggs. Peanut butter and other nut butters. Make sure that all meats, poultry, and eggs are cooked to food-safe temperatures or "well-done." Two or more servings of fish are recommended each week in order to get the most benefits from omega-3 fatty acids that are found in seafood. Choose fish that are lower in mercury. You can find more information online:  PumpkinSearch.com.ee Dairy Pasteurized milk and milk alternatives (such as almond milk). Pasteurized yogurt and pasteurized cheese. Cottage cheese. Sour cream. Beverages Water. Juices that contain 100% fruit juice or vegetable juice. Caffeine-free teas and decaffeinated coffee. Drinks that contain caffeine are okay to drink, but it is better to avoid caffeine. Keep your total caffeine intake to less than 200 mg each day (which is 12 oz or 355 mL of coffee, tea, or soda) or the limit as told by your health care provider. Fats and oils Fats and oils are okay to include in moderation. Sweets and desserts Sweets and desserts are okay to include in moderation. Seasoning and other foods All pasteurized condiments. The items listed above may not be a complete list of foods and beverages you can eat. Contact a dietitian for more information. What foods are not recommended? Fruits Unpasteurized fruit juices. Vegetables Raw (unpasteurized) vegetable juices. Meats and other protein foods Lunch meats, bologna, hot dogs, or other deli meats. (If you must eat those meats, reheat them until they are steaming hot.) Refrigerated pat, meat spreads from a meat counter, smoked seafood that is found in the refrigerated section of a store. Raw or undercooked meats, poultry, and eggs. Raw fish, such as sushi or sashimi. Fish that have high  mercury content, such as tilefish, shark, swordfish, and king mackerel. To learn more about mercury in fish, talk with your health care provider or look for online resources, such as:  PumpkinSearch.com.ee Dairy Raw (unpasteurized) milk and any foods that have raw milk in them. Soft cheeses, such as feta, queso blanco, queso fresco, Brie, Camembert cheeses, blue-veined cheeses, and Panela cheese (unless it is made with pasteurized milk, which must be stated on the label). Beverages Alcohol. Sugar-sweetened beverages, such as sodas, teas, or energy drinks. Seasoning and other foods Homemade fermented foods and drinks, such as pickles, sauerkraut,  or kombucha drinks. (Store-bought pasteurized versions of these are okay.) Salads that are made in a store or deli, such as ham salad, chicken salad, egg salad, tuna salad, and seafood salad. The items listed above may not be a complete list of foods and beverages you should avoid. Contact a dietitian for more information. Where to find more information To calculate the number of calories you need based on your height, weight, and activity level, you can use an online calculator such as:  PackageNews.is To calculate how much weight you should gain during pregnancy, you can use an online pregnancy weight gain calculator such as:  http://jones-berg.com/ Summary  While you are pregnant, your body requires additional nutrition to help support your growing baby.  Eat a variety of foods, especially fruits and vegetables to get a full range of vitamins and minerals.  Practice good food safety and cleanliness. Wash your hands before you eat and after you prepare raw meat. Wash all fruits and vegetables well before peeling or eating. Taking these actions can help to prevent food-borne illnesses, such as listeriosis, that can be very dangerous to your baby.  Do not eat raw meat or fish. Do not eat fish that have high  mercury content, such as tilefish, shark, swordfish, and king mackerel. Do not eat unpasteurized (raw) dairy.  Take a prenatal vitamin to help meet your additional vitamin and mineral needs during pregnancy, specifically for folic acid, iron, calcium, and vitamin D. This information is not intended to replace advice given to you by your health care provider. Make sure you discuss any questions you have with your health care provider. Document Revised: 05/06/2019 Document Reviewed: 09/12/2017 Elsevier Patient Education  2020 [employer].
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
## 2020-01-07 ENCOUNTER — Telehealth: Payer: Medicaid Other | Admitting: Obstetrics

## 2020-01-07 ENCOUNTER — Telehealth: Payer: Medicaid Other | Admitting: Medical

## 2020-01-07 LAB — GC/CHLAMYDIA PROBE AMP (~~LOC~~) NOT AT ARMC
Chlamydia: NEGATIVE
Comment: NEGATIVE
Comment: NORMAL
Neisseria Gonorrhea: NEGATIVE

## 2020-01-10 ENCOUNTER — Telehealth (INDEPENDENT_AMBULATORY_CARE_PROVIDER_SITE_OTHER): Payer: Medicaid Other | Admitting: Obstetrics and Gynecology

## 2020-01-10 ENCOUNTER — Encounter: Payer: Self-pay | Admitting: Obstetrics and Gynecology

## 2020-01-10 DIAGNOSIS — Z3400 Encounter for supervision of normal first pregnancy, unspecified trimester: Secondary | ICD-10-CM

## 2020-01-10 DIAGNOSIS — O99820 Streptococcus B carrier state complicating pregnancy: Secondary | ICD-10-CM

## 2020-01-10 DIAGNOSIS — R8271 Bacteriuria: Secondary | ICD-10-CM

## 2020-01-10 DIAGNOSIS — Z3A32 32 weeks gestation of pregnancy: Secondary | ICD-10-CM

## 2020-01-10 NOTE — Progress Notes (Signed)
   TELEHEALTH OBSTETRICS PRENATAL VIRTUAL VIDEO VISIT ENCOUNTER NOTE  Provider location: Center for [facility] [place]   I connected with Kristi Roth on 01/10/20 at  8:30 AM EST by MyChart Video Encounter at home and verified that I am speaking with the correct person using two identifiers.   I discussed the limitations, risks, security and privacy concerns of performing an evaluation and management service virtually and the availability of in person appointments. I also discussed with the patient that there may be a patient responsible charge related to this service. The patient expressed understanding and agreed to proceed. Subjective:  Kristi Roth is a 24 y.o. G1P0 at [redacted]w[redacted]d being seen today for ongoing prenatal care.  She is currently monitored for the following issues for this low-risk pregnancy and has Supervision of normal first pregnancy, antepartum and GBS bacteriuria on their problem list.  Patient reports no complaints.  Contractions: Not present. Vag. Bleeding: None.  Movement: Present. Denies any leaking of fluid.   The following portions of the patient's history were reviewed and updated as appropriate: allergies, current medications, past family history, past medical history, past social history, past surgical history and problem list.   Objective:  There were no vitals filed for this visit.  Fetal Status:     Movement: Present     General:  Alert, oriented and cooperative. Patient is in no acute distress.  Respiratory: Normal respiratory effort, no problems with respiration noted  Mental Status: Normal mood and affect. Normal behavior. Normal judgment and thought content.  Rest of physical exam deferred due to type of encounter  Imaging: No results found.  Assessment and Plan:  Pregnancy: G1P0 at [redacted]w[redacted]d 1. GBS bacteriuria Prophylaxis in labor  2. Supervision of normal first pregnancy, antepartum Patient is doing well without complaints Reviewed normal  glucola results Patient is still researching pediatrician  Preterm labor symptoms and general obstetric precautions including but not limited to vaginal bleeding, contractions, leaking of fluid and fetal movement were reviewed in detail with the patient. I discussed the assessment and treatment plan with the patient. The patient was provided an opportunity to ask questions and all were answered. The patient agreed with the plan and demonstrated an understanding of the instructions. The patient was advised to call back or seek an in-person office evaluation/go to MAU at [facility] for any urgent or concerning symptoms. Please refer to After Visit Summary for other counseling recommendations.   I provided 12 minutes of face-to-face time during this encounter.  No follow-ups on file.  No future appointments.  Catalina Antigua, MD Center for [employer], [facility]

## 2020-01-26 ENCOUNTER — Ambulatory Visit (HOSPITAL_BASED_OUTPATIENT_CLINIC_OR_DEPARTMENT_OTHER)
Admission: RE | Admit: 2020-01-26 | Discharge: 2020-01-26 | Disposition: A | Discharge status: Home / Self Care | Payer: Medicaid Other | Source: Ambulatory Visit | Attending: Obstetrics and Gynecology | Admitting: Obstetrics and Gynecology

## 2020-01-26 ENCOUNTER — Other Ambulatory Visit (HOSPITAL_COMMUNITY)
Admission: RE | Admit: 2020-01-26 | Discharge: 2020-01-26 | Disposition: A | Discharge status: Home / Self Care | Payer: Medicaid Other | Source: Ambulatory Visit | Attending: Certified Nurse Midwife | Admitting: Certified Nurse Midwife

## 2020-01-26 ENCOUNTER — Encounter: Payer: Self-pay | Admitting: *Deleted

## 2020-01-26 ENCOUNTER — Other Ambulatory Visit (HOSPITAL_COMMUNITY): Payer: Self-pay | Admitting: *Deleted

## 2020-01-26 ENCOUNTER — Encounter: Payer: Self-pay | Admitting: Certified Nurse Midwife

## 2020-01-26 ENCOUNTER — Ambulatory Visit (INDEPENDENT_AMBULATORY_CARE_PROVIDER_SITE_OTHER): Payer: Medicaid Other | Admitting: Certified Nurse Midwife

## 2020-01-26 ENCOUNTER — Other Ambulatory Visit: Payer: Self-pay

## 2020-01-26 VITALS — BP 122/76 | HR 111 | Wt 198.2 lb

## 2020-01-26 DIAGNOSIS — O4590 Premature separation of placenta, unspecified, unspecified trimester: Secondary | ICD-10-CM

## 2020-01-26 DIAGNOSIS — Z3400 Encounter for supervision of normal first pregnancy, unspecified trimester: Secondary | ICD-10-CM

## 2020-01-26 DIAGNOSIS — N898 Other specified noninflammatory disorders of vagina: Secondary | ICD-10-CM | POA: Insufficient documentation

## 2020-01-26 DIAGNOSIS — Z3A35 35 weeks gestation of pregnancy: Secondary | ICD-10-CM

## 2020-01-26 DIAGNOSIS — Z362 Encounter for other antenatal screening follow-up: Secondary | ICD-10-CM | POA: Diagnosis not present

## 2020-01-26 DIAGNOSIS — O4693 Antepartum hemorrhage, unspecified, third trimester: Secondary | ICD-10-CM | POA: Diagnosis not present

## 2020-01-26 DIAGNOSIS — O26893 Other specified pregnancy related conditions, third trimester: Secondary | ICD-10-CM

## 2020-01-26 MED ORDER — METRONIDAZOLE 500 MG PO TABS: 500.0000 mg | ORAL_TABLET | Freq: Two times a day (BID) | ORAL | 0 refills | Status: DC

## 2020-01-26 NOTE — Patient Instructions (Signed)

## 2020-01-26 NOTE — Progress Notes (Signed)
PRENATAL VISIT NOTE  Subjective:  Kristi Roth is a 24 y.o. G1P0 at [redacted]w[redacted]d being seen today for ongoing prenatal care.  She is currently monitored for the following issues for this low-risk pregnancy and has Supervision of normal first pregnancy, antepartum and GBS bacteriuria on their problem list.  Patient reports vaginal bleeding.  Contractions: Not present. Vag. Bleeding: Scant.  Movement: Present. Denies leaking of fluid.   The following portions of the patient's history were reviewed and updated as appropriate: allergies, current medications, past family history, past medical history, past social history, past surgical history and problem list.   Objective:   Vitals:   01/26/20 0943  BP: 122/76  Pulse: (!) 111  Weight: 198 lb 3.2 oz (89.9 kg)    Fetal Status: Fetal Heart Rate (bpm): 170   Movement: Present     General:  Alert, oriented and cooperative. Patient is in no acute distress.  Skin: Skin is warm and dry. No rash noted.   Cardiovascular: Normal heart rate noted  Respiratory: Normal respiratory effort, no problems with respiration noted  Abdomen: Soft, gravid, appropriate for gestational age.  Pain/Pressure: Absent     Pelvic: Cervical exam deferred       Pelvic exam: Cervix pink, visually closed, without lesion, large amount of light pink malodorous thin discharge present, vaginal walls and external genitalia normal  Extremities: Normal range of motion.  Edema: Trace  Mental Status: Normal mood and affect. Normal behavior. Normal judgment and thought content.   Assessment and Plan:  Pregnancy: G1P0 at [redacted]w[redacted]d 1. Supervision of normal first pregnancy, antepartum - Patient reports on and off vaginal bleeding for the past 2 weeks  - Patient reports being seen in MAU for this on 1/7, wet prep and GC/C was negative, patient denies having an US performed that day  - She describes the vaginal bleeding as light pink spotting that becomes dark red then bright red then stops  and starts cycle over again.  - Reports having to wear panty liner for bleeding denies having to wear pad, denies abdominal pain/cramping  - Pelvic examination today noted pink tinged discharge with odor  - Patient sent to MFM for evaluation and [place]  - Anticipatory guidance on upcoming appointments and plan of care  - [place] MFM OB FOLLOW UP; Future  2. Vaginal discharge during pregnancy in third trimester - Will treat for BV while cultures and [place] pending  - Cervicovaginal ancillary only( [place]) - metroNIDAZOLE (FLAGYL) 500 MG tablet; Take 1 tablet (500 mg total) by mouth 2 (two) times daily.  Dispense: 14 tablet; Refill: 0  Preterm labor symptoms and general obstetric precautions including but not limited to vaginal bleeding, contractions, leaking of fluid and fetal movement were reviewed in detail with the patient. Please refer to After Visit Summary for other counseling recommendations.   Return in about 1 week (around 02/02/2020) for ROB/GBS - in person with [place] .  Future Appointments  Date Time Provider Department Center  02/02/2020  9:55 AM Sharyon Cable, CNM [facility] None  02/02/2020 11:00 AM [facility] [place] 3 [facility]   Sharyon Cable, CNM     [place] MFM OB FOLLOW UP  Result Date: 01/26/2020 ----------------------------------------------------------------------  OBSTETRICS REPORT                       (Signed Final 01/26/2020 01:53 pm) ---------------------------------------------------------------------- Patient Info  ID #:       [id number]  D.O.B.:  04-12-1996 (23 yrs)  Name:       Kristi Roth                   Visit Date: 01/26/2020 11:42 am ---------------------------------------------------------------------- Performed By  Performed By:     Emeline DarlingKasie E Kiser BS,      Ref. Address:     Faculty                    RDMS  Attending:        Lin Landsmanorenthian Booker      [facility]                    MD                                        [facility]  Referred By:      Sharyon CableVERONICA C                    Odas Roth CNM ---------------------------------------------------------------------- Orders   #  Description                          Code         Ordered By   1  US MFM OB FOLLOW UP                  [id number]     Joseh Sjogren  ----------------------------------------------------------------------   #  Order #                    Accession #                 Episode #   1  [id number]                  [id number]                  [id number]  ---------------------------------------------------------------------- Indications   Vaginal bleeding in pregnancy, third           O46.93   trimester x 2 weeks   [redacted] weeks gestation of pregnancy                Z3A.35   Encounter for other antenatal screening        Z36.2   follow-up  ---------------------------------------------------------------------- Vital Signs                                                 Height:        5'6" ---------------------------------------------------------------------- Fetal Evaluation  Num Of Fetuses:         1  Fetal Heart Rate(bpm):  157  Cardiac Activity:       Observed  Presentation:           Cephalic  Placenta:               Posterior  P. Cord Insertion:      Previously Visualized  Amniotic Fluid  AFI FV:      Within normal limits  AFI Sum(cm)     %Tile       Largest  Pocket(cm)  15.29           55          6.01  RUQ(cm)                     LUQ(cm)        LLQ(cm)  4.23                        6.01           5.05  Comment:    No placental abruption identified on Ultrasound today. ---------------------------------------------------------------------- Biometry  BPD:      85.5  mm     G. Age:  34w 3d         36  %    CI:        73.53   %    70 - 86                                                          FL/HC:      20.6   %    20.1 - 22.3  HC:      316.8  mm     G. Age:  35w 4d         30  %    HC/AC:      0.96        0.93 - 1.11  AC:      329.2  mm     G. Age:  36w 6d         94  %     FL/BPD:     76.4   %    71 - 87  FL:       65.3  mm     G. Age:  33w 5d         13  %    FL/AC:      19.8   %    20 - 24  Est. FW:    2731  gm           6 lb     66  % ---------------------------------------------------------------------- OB History  Gravidity:    1 ---------------------------------------------------------------------- Gestational Age  U/S Today:     35w 1d                                        EDD:   02/29/20  Best:          35w 0d     Det. ByMarcella Dubs         EDD:   03/01/20                                      (07/26/19) ---------------------------------------------------------------------- Anatomy  Cranium:               Appears normal         Aortic Arch:            Appears normal  Cavum:  Previously seen        Ductal Arch:            Appears normal  Ventricles:            Previously seen        Diaphragm:              Appears normal  Choroid Plexus:        Previously seen        Stomach:                Appears normal, left                                                                        sided  Cerebellum:            Previously seen        Abdomen:                Appears normal  Posterior Fossa:       Previously seen        Abdominal Wall:         Appears nml (cord                                                                        insert, abd wall)  Nuchal Fold:           Previously seen        Cord Vessels:           Appears normal (3                                                                        vessel cord)  Face:                  Orbits and profile     Kidneys:                Appear normal                         previously seen  Lips:                  Previously seen        Bladder:                Appears normal  Thoracic:              Appears normal         Spine:                  Appears normal  Heart:  Appears normal         Upper Extremities:      Appears normal                         (4CH, axis, and                          situs)  RVOT:                  Appears normal         Lower Extremities:      Appears normal  LVOT:                  Appears normal  Other:  Heels and 5th digit visualized. Female gender. Nasal bone visualized.          Technically difficult due to fetal position. ---------------------------------------------------------------------- Cervix Uterus Adnexa  Cervix  Not visualized (advanced GA >24wks) ---------------------------------------------------------------------- Impression  Kristi Roth is here for follow up growth. She also complained  of bright red vaginal bleeding that has persisted on a weekly  basis since Christmas after a signficant fall.  Today she denies uterine tenderness or contractions. Today  we observed normal fetal growth, amniotic fluid and fetal  movement. There was no evidence of placental abruption or  previa.  I discussed the suggested diagnosis of chronic placental  abruption. I discussed the management to include inpatient  management or outpatient modified bed rest. She expressed  financial pressure and needing to work and would like to  avoid hospitalization at this time. However, she said she  would consider and speak with her work. I also recommend  delivery between 36-37 weeks if the bleeding persisted.  I spoke with Kristi Roth, CNM regarding this plan.  She will speak with Kristi Roth and offer outpatient steroids as  well.  If Kristi Roth remains outpatient we have scheduled her to  return in 1 week for a BPP. ---------------------------------------------------------------------- Recommendations  Inpatient/outpatient rest  BMZ  Consider delivery between 36-37 weeks. ----------------------------------------------------------------------               Sander Nephew, MD Electronically Signed Final Report   01/26/2020 01:53 pm ----------------------------------------------------------------------  Received phone call from MFM (Dr Gertie Exon) about [place] results and his concern with Kristi Roth having a chronic placental abruption based on her continued vaginal spotting, he encouraged Kristi Roth to go to hospital for inpatient management but she declined at that time   Called patient myself to discussed risk of placental abruption and outpatient vs inpatient management. Patient concerned at this time about her job - discussed with patient that job will be protected with FMLA/maternity leave. Discussed with patient MFM recommendation of delivery between 36-37 weeks. Patient declines coming to hospital at this time, reports she has to pack bag and prepare. Encouraged patient to performed modified bed rest and have someone else prepare for baby. Discussed warning signs and reasons to present to MAU for evaluation. Plan to talk to patient in morning to discuss inpatient management, patient agrees to plan of care.    Lajean Manes, CNM 01/26/20, 5:01 PM

## 2020-01-26 NOTE — Progress Notes (Signed)
Pt is here for ROB. Pt reports she that she has been spotting for 2 weeks.

## 2020-01-27 ENCOUNTER — Other Ambulatory Visit: Payer: Self-pay | Admitting: Certified Nurse Midwife

## 2020-01-27 ENCOUNTER — Encounter (HOSPITAL_COMMUNITY): Payer: Self-pay | Admitting: Obstetrics & Gynecology

## 2020-01-27 ENCOUNTER — Inpatient Hospital Stay (HOSPITAL_COMMUNITY)
Admission: AD | Admit: 2020-01-27 | Discharge: 2020-02-08 | DRG: 833 | Disposition: A | Discharge status: Home / Self Care | Payer: Medicaid Other | Attending: Obstetrics and Gynecology | Admitting: Obstetrics and Gynecology

## 2020-01-27 ENCOUNTER — Other Ambulatory Visit: Payer: Self-pay

## 2020-01-27 DIAGNOSIS — W19XXXA Unspecified fall, initial encounter: Secondary | ICD-10-CM | POA: Diagnosis not present

## 2020-01-27 DIAGNOSIS — M25551 Pain in right hip: Secondary | ICD-10-CM | POA: Diagnosis not present

## 2020-01-27 DIAGNOSIS — Z3A35 35 weeks gestation of pregnancy: Secondary | ICD-10-CM

## 2020-01-27 DIAGNOSIS — O26893 Other specified pregnancy related conditions, third trimester: Secondary | ICD-10-CM | POA: Diagnosis not present

## 2020-01-27 DIAGNOSIS — Z20822 Contact with and (suspected) exposure to covid-19: Secondary | ICD-10-CM | POA: Diagnosis present

## 2020-01-27 DIAGNOSIS — Z3400 Encounter for supervision of normal first pregnancy, unspecified trimester: Secondary | ICD-10-CM

## 2020-01-27 DIAGNOSIS — O4693 Antepartum hemorrhage, unspecified, third trimester: Secondary | ICD-10-CM | POA: Diagnosis not present

## 2020-01-27 DIAGNOSIS — O0990 Supervision of high risk pregnancy, unspecified, unspecified trimester: Secondary | ICD-10-CM

## 2020-01-27 DIAGNOSIS — Y92231 Patient bathroom in hospital as the place of occurrence of the external cause: Secondary | ICD-10-CM | POA: Diagnosis not present

## 2020-01-27 DIAGNOSIS — Z3A36 36 weeks gestation of pregnancy: Secondary | ICD-10-CM | POA: Diagnosis not present

## 2020-01-27 LAB — URINALYSIS, ROUTINE W REFLEX MICROSCOPIC
Bilirubin Urine: NEGATIVE
Glucose, UA: NEGATIVE mg/dL
Hgb urine dipstick: NEGATIVE
Ketones, ur: NEGATIVE mg/dL
Nitrite: NEGATIVE
Protein, ur: NEGATIVE mg/dL
Specific Gravity, Urine: 1.017 (ref 1.005–1.030)
WBC, UA: >50 WBC/hpf — ABNORMAL HIGH (ref 0–5)
pH: 6 (ref 5.0–8.0)

## 2020-01-27 LAB — CBC
HCT: 37.1 % (ref 36.0–46.0)
Hemoglobin: 12.5 g/dL (ref 12.0–15.0)
MCH: 30.6 pg (ref 26.0–34.0)
MCHC: 33.7 g/dL (ref 30.0–36.0)
MCV: 90.7 fL (ref 80.0–100.0)
Platelets: 231 10*3/uL (ref 150–400)
RBC: 4.09 MIL/uL (ref 3.87–5.11)
RDW: 12.1 % (ref 11.5–15.5)
WBC: 8.2 10*3/uL (ref 4.0–10.5)
nRBC: 0 % (ref 0.0–0.2)

## 2020-01-27 LAB — SARS CORONAVIRUS 2 (TAT 6-24 HRS): SARS Coronavirus 2: NEGATIVE

## 2020-01-27 LAB — TYPE AND SCREEN
ABO/RH(D): AB POS
Antibody Screen: NEGATIVE

## 2020-01-27 LAB — CERVICOVAGINAL ANCILLARY ONLY
Bacterial Vaginitis (gardnerella): POSITIVE — AB
Candida Glabrata: NEGATIVE
Candida Vaginitis: POSITIVE — AB
Chlamydia: NEGATIVE
Comment: NEGATIVE
Comment: NEGATIVE
Comment: NEGATIVE
Comment: NEGATIVE
Comment: NEGATIVE
Comment: NORMAL
Neisseria Gonorrhea: NEGATIVE
Trichomonas: NEGATIVE

## 2020-01-27 MED ORDER — BETAMETHASONE SOD PHOS & ACET 6 (3-3) MG/ML IJ SUSP
12.0000 mg | INTRAMUSCULAR | Status: AC
  Administered 2020-01-27 – 2020-01-28 (×2): 12 mg via INTRAMUSCULAR
  Filled 2020-01-27 (×2): qty 5

## 2020-01-27 MED ORDER — DOCUSATE SODIUM 100 MG PO CAPS
100.0000 mg | ORAL_CAPSULE | Freq: Every day | ORAL | Status: DC
  Administered 2020-01-27 – 2020-02-03 (×8): 100 mg via ORAL
  Filled 2020-01-27 (×10): qty 1

## 2020-01-27 MED ORDER — PRENATAL MULTIVITAMIN CH
1.0000 | ORAL_TABLET | Freq: Every day | ORAL | Status: DC
  Administered 2020-01-27 – 2020-02-08 (×12): 1 via ORAL
  Filled 2020-01-27 (×12): qty 1

## 2020-01-27 MED ORDER — ZOLPIDEM TARTRATE 5 MG PO TABS
5.0000 mg | ORAL_TABLET | Freq: Every evening | ORAL | Status: DC | PRN
  Administered 2020-01-28 – 2020-02-07 (×10): 5 mg via ORAL
  Filled 2020-01-27 (×10): qty 1

## 2020-01-27 MED ORDER — CALCIUM CARBONATE ANTACID 500 MG PO CHEW: 2.0000 | CHEWABLE_TABLET | ORAL | Status: DC | PRN

## 2020-01-27 MED ORDER — ACETAMINOPHEN 325 MG PO TABS: 650.0000 mg | ORAL_TABLET | ORAL | Status: DC | PRN

## 2020-01-27 MED ORDER — METRONIDAZOLE 500 MG PO TABS
500.0000 mg | ORAL_TABLET | Freq: Two times a day (BID) | ORAL | Status: AC
  Administered 2020-01-27 – 2020-02-02 (×12): 500 mg via ORAL
  Filled 2020-01-27 (×13): qty 1
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
## 2020-01-28 LAB — URINE CULTURE: Culture: 50000 — AB

## 2020-01-28 NOTE — Progress Notes (Signed)
Patient ID: Kristi Roth, female   DOB: [birth year], 24 y.o.   MRN: [id number] FACULTY PRACTICE ANTEPARTUM(COMPREHENSIVE) NOTE  HOORIA GASPARINI is a 24 y.o. G1P0 at [redacted]w[redacted]d by best clinical estimate who is admitted for vaginal bleeding.   Fetal presentation is cephalic. Length of Stay:  1  Days  Subjective: Bleeding is decreased Patient reports the fetal movement as active. Patient reports uterine contraction  activity as irregular. Patient reports  vaginal bleeding as spotting. Patient describes fluid per vagina as None.  Vitals:  Blood pressure (!) 115/42, pulse 86, temperature 98.1 F (36.7 C), temperature source Oral, resp. rate 18, height 5\' 8"  (1.727 m), weight 89.8 kg, last menstrual period 05/26/2019, SpO2 100 %. Physical Examination:  General appearance - alert, well appearing, and in no distress Heart - normal rate and regular rhythm Abdomen - soft, nontender, nondistended Fundal Height:  size equals dates Cervical Exam: Not evaluated.  Extremities: extremities normal, atraumatic, no cyanosis or edema and Homans sign is negative, no sign of DVT Membranes:intact  Fetal Monitoring:  Baseline: 150 bpm, Variability: Good {> 6 bpm), Accelerations: Reactive and Decelerations: Absent  Labs:  Results for orders placed or performed during the hospital encounter of 01/27/20 (from the past 24 hour(s))  SARS CORONAVIRUS 2 (TAT 6-24 HRS) Nasopharyngeal Nasopharyngeal Swab   Collection Time: 01/27/20 12:56 PM   Specimen: Nasopharyngeal Swab  Result Value Ref Range   SARS Coronavirus 2 NEGATIVE NEGATIVE  CBC on admission   Collection Time: 01/27/20  1:44 PM  Result Value Ref Range   WBC 8.2 4.0 - 10.5 K/uL   RBC 4.09 3.87 - 5.11 MIL/uL   Hemoglobin 12.5 12.0 - 15.0 g/dL   HCT 01/29/20 28.7 - 86.7 %   MCV 90.7 80.0 - 100.0 fL   MCH 30.6 26.0 - 34.0 pg   MCHC 33.7 30.0 - 36.0 g/dL   RDW 67.2 09.4 - 70.9 %   Platelets 231 150 - 400 K/uL   nRBC 0.0 0.0 - 0.2 %  Type and screen [facility]   Collection Time: 01/27/20  1:44 PM  Result Value Ref Range   ABO/RH(D) AB POS    Antibody Screen NEG    Sample Expiration      01/30/2020,2359 Performed at [facility], [address], [place], [place]   Urinalysis, Routine w reflex microscopic   Collection Time: 01/27/20  3:00 PM  Result Value Ref Range   Color, Urine YELLOW YELLOW   APPearance CLOUDY (A) CLEAR   Specific Gravity, Urine 1.017 1.005 - 1.030   pH 6.0 5.0 - 8.0   Glucose, UA NEGATIVE NEGATIVE mg/dL   Hgb urine dipstick NEGATIVE NEGATIVE   Bilirubin Urine NEGATIVE NEGATIVE   Ketones, ur NEGATIVE NEGATIVE mg/dL   Protein, ur NEGATIVE NEGATIVE mg/dL   Nitrite NEGATIVE NEGATIVE   Leukocytes,Ua LARGE (A) NEGATIVE   RBC / HPF 0-5 0 - 5 RBC/hpf   WBC, UA >50 (H) 0 - 5 WBC/hpf   Bacteria, UA FEW (A) NONE SEEN   Squamous Epithelial / LPF 21-50 0 - 5   Mucus PRESENT    Non Squamous Epithelial 0-5 (A) NONE SEEN     Medications:  Scheduled . betamethasone acetate-betamethasone sodium phosphate  12 mg Intramuscular Q24H  . docusate sodium  100 mg Oral Daily  . metroNIDAZOLE  500 mg Oral BID PC  . prenatal multivitamin  1 tablet Oral Q1200   I have reviewed the patient's current medications.  ASSESSMENT: Patient Active Problem List   Diagnosis Date Noted  . Vaginal bleeding during pregnancy 01/27/2020  . GBS bacteriuria 09/06/2019  . Supervision of normal first pregnancy, antepartum 08/26/2019    PLAN: Follow for signs of labor or increased vaginal bleeding  Emeterio Reeve 01/28/2020,10:13 AM

## 2020-01-28 NOTE — Plan of Care (Signed)
  Problem: Education: Goal: Knowledge of disease or condition will improve Outcome: Progressing   Problem: Education: Goal: Knowledge of the prescribed therapeutic regimen will improve Outcome: Progressing   

## 2020-01-28 NOTE — Progress Notes (Signed)
I offered support and prayer with pt for her baby and a safe delivery.  She shared that her SO is in [place] until Feb 12 and her hope is that her baby will wait to come until then.  Her 22 year old step-son lives with her and her SO most of the time, but they have good family support from his family to help out especially while he is in [place].    Chaplain Dyanne Carrel, Bcc Pager, [phone] 3:04 PM    01/28/20 1500  Clinical Encounter Type  Visited With Patient  Visit Type Spiritual support  Spiritual Encounters  Spiritual Needs Prayer

## 2020-01-28 NOTE — Clinical SW OB High Risk (Signed)
Clinical Social Work Antenatal   Clinical Social Worker:  Dimple Nanas, LCSW Date/Time:  01/28/2020, 3:13 PM Gestational Age on Admission:  24 y.o. Admitting Diagnosis:  bleeding   Expected Delivery Date:  03/01/20  [facility] Environment  Home Address: [place], [place] [address]  Household Member/Support Name:  Self Relationship:    Other Support: Per MOB,  FOB's Family will provide supports if needed.   Psychosocial Data  Information Source:    Resources:     Employment: MOB's works at [employer]):  [employer]:    Current Grade:    Homebound Arranged: No  Other Resources:  Medicaid, Physicist, medical, [employer]  Cultural/Environment Issues Impacting Care:  Per MOB, MOB's family can be stressor.   Strengths/Weaknesses/Factors to Consider  Concerns Related to Hospitalization: none reported   Previous Pregnancies/Feelings Towards Pregnancy?  Concerns related to being/becoming a mother?:  N/A  Social Support (FOB? Who is/will be helping with baby/other kids?): Per MOB, FOB is [place].  MOB reports that FOB has been supportive throughout pregnancy and has assisted with obtaining essential items for infant.  Couples Relationship (describe):    Recent Stressful Life Events (life changes in past year?): Discord with MOB and MOB's family.    Prenatal Care/Education/Home Preparations: MOB is involved with [facility] and has been receiving prenatal with [name].   Domestic Violence (of any type):  No If Yes to Domestic Violence, Describe/Action Plan:  No   Substance Use During Pregnancy: No (If Yes, Complete SBIRT)  Complete PHQ-9 (Depresssion Screening) on all Antenatal Patients PHQ-9 Score (If Score => 15 complete TREAT):     Follow-up Recommendations:  Follow-up with OB provider as recommended and follow-up with Nurse [facility].    Patient Advised/Response:     Other:       Clinical  Assessment/Plan:   CSW met with MOB in room 103 to complete clinical assessment for infant readiness.  When CSW arrived, MOB shared that she was on the telephone with FOB. CSW offered to return at a later time and MOB declined. MOB gave CSW permission to complete the assessment while FOB was on the telephone; FOB did not engage with CSW.  CSW explained CSW's role and invited MOB to ask question if they arise.  MOB was polite, easy to engage, and was receptive to meeting with CSW.   CSW asked about MOB's thoughts and feeling about admission and MOB reported, "I feel fine. I'm glad that the bleeding has stopped and my son Arville Go is doing good." MOB reported that she had a fall in her 2nd trimester that resulted into a placenta abruption. MOB communicated, "I was having a heated debate with my family and before I knew it I fainted and fell out." MOB shared that her relationship with her biological family is know estranged and she is not interested in re-establishing the relationship.  MOB stated, "They are just toxic people."    CSW asked about MOB's preparedness for infant and MOB reported having a new car seat, diapers, wipes, bottles, and blankets.  MOB shared that MOB has not purchased a bed for infant but plan to obtain one prior to infant's delivery and discharge.  CSW made MOB aware to contact CSW if MOB is unable to obtain infant a bed and MOB agreed.   CSW assessed for MH hx and MOB acknowledged a hx of anxiety dx during her elementary school age.  MOB denied having any signs or symptoms  in the last 3 years.  CSW assessed for safety and MOB denied SI and HI.  MOB presented with insight and awareness and did not demonstrate any acute MH symptoms during clinical assessment.   CSW offered MOB resources for parenting education and supports and MOB declined.  MOB stated, "I'm already connected with the [facility]." MOB acknowledged receiving Food Stamps and WIC.   MOB asked for  resources for rental assistance and CSW provided information to local community agency that could assist; MOB expressed gratitude.   CSW thanked MOB for meeting with CSW and provided MOB with CSW contact information for future reference.   There are no barriers to discharge.   Laurey Arrow, MSW, LCSW Clinical Social Work [phone]

## 2020-01-29 ENCOUNTER — Encounter (HOSPITAL_COMMUNITY): Payer: Self-pay | Admitting: Obstetrics & Gynecology

## 2020-01-29 DIAGNOSIS — O4693 Antepartum hemorrhage, unspecified, third trimester: Principal | ICD-10-CM

## 2020-01-29 DIAGNOSIS — Z3A35 35 weeks gestation of pregnancy: Secondary | ICD-10-CM

## 2020-01-29 LAB — CULTURE, BETA STREP (GROUP B ONLY)

## 2020-01-29 NOTE — Progress Notes (Signed)
Patient ID: Kristi Roth, female   DOB: 09-29-1996, 24 y.o.   MRN: [id number] FACULTY PRACTICE ANTEPARTUM(COMPREHENSIVE) NOTE  SADIA BELFIORE is a 24 y.o. G1P0 at [redacted]w[redacted]d by best clinical estimate who is admitted for vaginal bleeding in the third trimester.   Fetal presentation is cephalic. Length of Stay:  2  Days  ASSESSMENT: Active Problems:   Vaginal bleeding during pregnancy   PLAN: Continue inpt. Hospitalization until delivery or no further bleeding  Subjective: Still with minimal spotting Patient reports the fetal movement as active. Patient reports uterine contraction  activity as none. Patient reports  vaginal bleeding as none. Patient describes fluid per vagina as None.  Vitals:  Blood pressure (!) 127/48, pulse 90, temperature 98.3 F (36.8 C), temperature source Oral, resp. rate 18, height 5\' 8"  (1.727 m), weight 89.8 kg, last menstrual period 05/26/2019, SpO2 100 %. Physical Examination:  General appearance - alert, well appearing, and in no distress Chest - normal effort Abdomen - gravid, non-tender Fundal Height:  size equals dates Extremities: Homans sign is negative, no sign of DVT  Membranes:intact  Fetal Monitoring:  Baseline: 135 bpm, Variability: Good {> 6 bpm), Accelerations: Reactive and Decelerations: Absent   Medications:  Scheduled . docusate sodium  100 mg Oral Daily  . metroNIDAZOLE  500 mg Oral BID PC  . prenatal multivitamin  1 tablet Oral Q1200   I have reviewed the patient's current medications.   05/28/2019, MD 01/29/2020,8:02 AM

## 2020-01-29 NOTE — Plan of Care (Signed)
  Problem: Clinical Measurements: Goal: Complications related to the disease process, condition or treatment will be avoided or minimized 01/29/2020 1152 by Rico Ala, RN Outcome: Progressing 01/28/2020 2203 by Rico Ala, RN Outcome: Progressing

## 2020-01-30 MED ORDER — CYCLOBENZAPRINE HCL 10 MG PO TABS
10.0000 mg | ORAL_TABLET | Freq: Three times a day (TID) | ORAL | Status: DC | PRN
  Administered 2020-02-05 – 2020-02-06 (×2): 10 mg via ORAL
  Filled 2020-01-30 (×2): qty 1

## 2020-01-30 MED ORDER — POLYETHYLENE GLYCOL 3350 17 G PO PACK
17.0000 g | PACK | Freq: Every day | ORAL | Status: DC
  Administered 2020-01-30 – 2020-02-02 (×3): 17 g via ORAL
  Filled 2020-01-30 (×7): qty 1

## 2020-01-30 NOTE — Progress Notes (Signed)
Patient ID: Kristi Roth, female   DOB: 11/10/1996, 24 y.o.   MRN: [id number] FACULTY PRACTICE ANTEPARTUM(COMPREHENSIVE) NOTE  Kristi Roth is a 24 y.o. G1P0 at [redacted]w[redacted]d by best clinical estimate who is admitted for vaginal bleeding in the third trimester.   Fetal presentation is cephalic. Length of Stay:  3  Days  ASSESSMENT: Active Problems:   Vaginal bleeding during pregnancy   PLAN: Continue inpt. Hospitalization until delivery or no further bleeding  Subjective: Still with minimal spotting, a dark red color.  Patient reports the fetal movement as active. Patient reports uterine contraction  activity as none. Patient reports  vaginal bleeding as none. Patient describes fluid per vagina as None.  Vitals:  VSS, AF, height 5\' 8"  (1.727 m), weight 89.8 kg, last menstrual period 05/26/2019, SpO2 100 %. Physical Examination:  General appearance - alert, well appearing, and in no distress Chest - normal effort Abdomen - gravid, non-tender Extremities: Homans sign is negative, no sign of DVT  Membranes:intact per history  Fetal Monitoring:  Baseline: 135 bpm, Variability: Good {> 6 bpm), Accelerations: Reactive and Decelerations: Absent   Medications:  Scheduled . docusate sodium  100 mg Oral Daily  . metroNIDAZOLE  500 mg Oral BID PC  . prenatal multivitamin  1 tablet Oral Q1200   I have reviewed the patient's current medications.

## 2020-01-31 NOTE — Progress Notes (Signed)
Pt states she is sleepy and does not want to take her meds now  And informed me to come back at 1000

## 2020-01-31 NOTE — Progress Notes (Signed)
Patient ID: Kristi Roth, female   DOB: 1996-02-13, 24 y.o.   MRN: [id number] FACULTY PRACTICE ANTEPARTUM(COMPREHENSIVE) NOTE  Kristi Roth is Kristi 24 y.o. G1P0 at [redacted]w[redacted]d by best clinical estimate who is admitted for vaginal bleeding in the third trimester.   Fetal presentation is cephalic. Length of Stay:  4  Days  Subjective: Still with minimal spotting Patient reports the fetal movement as active. Patient reports uterine contraction  activity as none. Patient reports  vaginal bleeding as none. Patient describes fluid per vagina as None.  Vitals:  Blood pressure (!) 112/53, pulse 84, temperature 98.3 F (36.8 C), temperature source Oral, resp. rate 18, height 5\' 8"  (1.727 m), weight 89.8 kg, last menstrual period 05/26/2019, SpO2 100 %. Physical Examination:  General appearance - alert, well appearing, and in no distress Chest - normal effort Abdomen - gravid, non-tender Fundal Height:  size equals dates Extremities: Homans sign is negative, no sign of DVT  Membranes:intact  Fetal Monitoring:  Baseline: 135 bpm, Variability: Good {> 6 bpm), Accelerations: Reactive and Decelerations: Absent   Medications:  Scheduled . docusate sodium  100 mg Oral Daily  . metroNIDAZOLE  500 mg Oral BID PC  . polyethylene glycol  17 g Oral Daily  . prenatal multivitamin  1 tablet Oral Q1200   I have reviewed the patient's current medications.   Imaging: 05/28/2019 MFM OB FOLLOW UP  Result Date: 01/26/2020 ----------------------------------------------------------------------  OBSTETRICS REPORT                       (Signed Final 01/26/2020 01:53 pm) ---------------------------------------------------------------------- Patient Info  ID #:       01/28/2020                          D.O.B.:  May 16, 1996 (24 yrs)  Name:       [birth year] Kristi Roth                   Visit Date: 01/26/2020 11:42 am ---------------------------------------------------------------------- Performed By  Performed By:     01/28/2020 BS,       Ref. Address:     Faculty                    RDMS  Attending:        Emeline Darling      [facility]                    MD                                       Fetal Care  Referred By:      Lin Landsman CNM ---------------------------------------------------------------------- Orders   #  Description                          Code         Ordered By   1  Sharyon Cable MFM OB FOLLOW UP                  [place]     VERONICA ROGERS  ----------------------------------------------------------------------   #  Order #                    Accession #                 Episode #   1  [id number]                  [id number]                  [id number]  ---------------------------------------------------------------------- Indications   Vaginal bleeding in pregnancy, third           O46.93   trimester x 2 weeks   [redacted] weeks gestation of pregnancy                Z3A.35   Encounter for other antenatal screening        Z36.2   follow-up  ---------------------------------------------------------------------- Vital Signs                                                 Height:        5'6" ---------------------------------------------------------------------- Fetal Evaluation  Num Of Fetuses:         1  Fetal Heart Rate(bpm):  157  Cardiac Activity:       Observed  Presentation:           Cephalic  Placenta:               Posterior  P. Cord Insertion:      Previously Visualized  Amniotic Fluid  AFI FV:      Within normal limits  AFI Sum(cm)     %Tile       Largest Pocket(cm)  15.29           55          6.01  RUQ(cm)                     LUQ(cm)        LLQ(cm)  4.23                        6.01           5.05  Comment:    No placental abruption identified on Ultrasound today. ---------------------------------------------------------------------- Biometry  BPD:      85.5  mm     G. Age:  34w 3d         36  %    CI:        73.53   %    70 - 86                                                           FL/HC:      20.6   %    20.1 - 22.3  HC:      316.8  mm     G. Age:  35w 4d         30  %    HC/AC:      0.96        0.93 - 1.11  AC:  329.2  mm     G. Age:  36w 6d         94  %    FL/BPD:     76.4   %    71 - 87  FL:       65.3  mm     G. Age:  33w 5d         13  %    FL/AC:      19.8   %    20 - 24  Est. FW:    2731  gm           6 lb     66  % ---------------------------------------------------------------------- OB History  Gravidity:    1 ---------------------------------------------------------------------- Gestational Age  U/S Today:     35w 1d                                        EDD:   02/29/20  Best:          35w 0d     Det. ByMarcella Dubs         EDD:   03/01/20                                      (07/26/19) ---------------------------------------------------------------------- Anatomy  Cranium:               Appears normal         Aortic Arch:            Appears normal  Cavum:                 Previously seen        Ductal Arch:            Appears normal  Ventricles:            Previously seen        Diaphragm:              Appears normal  Choroid Plexus:        Previously seen        Stomach:                Appears normal, left                                                                        sided  Cerebellum:            Previously seen        Abdomen:                Appears normal  Posterior Fossa:       Previously seen        Abdominal Wall:         Appears nml (cord  insert, abd wall)  Nuchal Fold:           Previously seen        Cord Vessels:           Appears normal (3                                                                        vessel cord)  Face:                  Orbits and profile     Kidneys:                Appear normal                         previously seen  Lips:                  Previously seen        Bladder:                Appears normal  Thoracic:              Appears normal          Spine:                  Appears normal  Heart:                 Appears normal         Upper Extremities:      Appears normal                         (4CH, axis, and                         situs)  RVOT:                  Appears normal         Lower Extremities:      Appears normal  LVOT:                  Appears normal  Other:  Heels and 5th digit visualized. Female gender. Nasal bone visualized.          Technically difficult due to fetal position. ---------------------------------------------------------------------- Cervix Uterus Adnexa  Cervix  Not visualized (advanced GA >24wks) ---------------------------------------------------------------------- Impression  Kristi Roth is here for follow up growth. She also complained  of bright red vaginal bleeding that has persisted on Kristi weekly  basis since Christmas after Kristi signficant fall.  Today she denies uterine tenderness or contractions. Today  we observed normal fetal growth, amniotic fluid and fetal  movement. There was no evidence of placental abruption or  previa.  I discussed the suggested diagnosis of chronic placental  abruption. I discussed the management to include inpatient  management or outpatient modified bed rest. She expressed  financial pressure and needing to work and would like to  avoid hospitalization at this time. However, she said she  would consider and speak with her work. I also recommend  delivery between 36-37 weeks if the bleeding persisted.  I spoke with  Ms. Darrol Poke, CNM regarding this plan.  She will speak with Ms. Hagin and offer outpatient steroids as  well.  If Ms. Clapsaddle remains outpatient we have scheduled her to  return in 1 week for Kristi BPP. ---------------------------------------------------------------------- Recommendations  Inpatient/outpatient rest  BMZ  Consider delivery between 36-37 weeks. ----------------------------------------------------------------------               Sander Nephew, MD Electronically Signed  Final Report   01/26/2020 01:53 pm ----------------------------------------------------------------------   ASSESSMENT: Active Problems:   Vaginal bleeding during pregnancy   PLAN: Continue inpatient hospitalization until delivery or no further bleeding BPP had been scheduled for 2/3, will change to inpatient. Routine antenatal care  Verita Schneiders, MD 01/31/2020,7:03 AM

## 2020-02-01 DIAGNOSIS — Z3A36 36 weeks gestation of pregnancy: Secondary | ICD-10-CM

## 2020-02-01 LAB — TYPE AND SCREEN
ABO/RH(D): AB POS
Antibody Screen: NEGATIVE

## 2020-02-01 NOTE — Progress Notes (Signed)
Pt requesting off unit privileges to go to cafeteria.  Per Dr. Jolayne Panther, patient may go to cafeteria just this time.   Patient understands this is a one time exception and to return quickly.

## 2020-02-01 NOTE — Progress Notes (Signed)
Patient ID: [name], female   DOB: [birth year], 24 y.o.   MRN: [id number] [facility] PRACTICE ANTEPARTUM(COMPREHENSIVE) NOTE  ROBENA EWY is a 24 y.o. G1P0 at [redacted]w[redacted]d by best clinical estimate who is admitted for vaginal bleeding in the third trimester.   Fetal presentation is cephalic. Length of Stay:  5  Days  Subjective: Patient denies any further episodes of vaginal bleeding since yesterday morning Patient reports the fetal movement as active. Patient reports uterine contraction  activity as none. Patient reports  vaginal bleeding as none. Patient describes fluid per vagina as None.  Vitals:  Blood pressure 117/68, pulse 100, temperature 98.3 F (36.8 C), temperature source Oral, resp. rate 18, height 5\' 8"  (1.727 m), weight 89.8 kg, last menstrual period 05/26/2019, SpO2 100 %. Physical Examination:  General appearance - alert, well appearing, and in no distress Chest - normal effort Abdomen - gravid, non-tender Fundal Height:  size equals dates Extremities: Homans sign is negative, no sign of DVT  Membranes:intact  Fetal Monitoring:  Baseline: 150 bpm, Variability: Good {> 6 bpm), Accelerations: Reactive and Decelerations: Absent   Medications:  Scheduled . docusate sodium  100 mg Oral Daily  . metroNIDAZOLE  500 mg Oral BID PC  . polyethylene glycol  17 g Oral Daily  . prenatal multivitamin  1 tablet Oral Q1200   I have reviewed the patient's current medications.   Imaging: 05/28/2019 MFM OB FOLLOW UP  Result Date: 01/26/2020 ----------------------------------------------------------------------  OBSTETRICS REPORT                       (Signed Final 01/26/2020 01:53 pm) ---------------------------------------------------------------------- Patient Info  ID #:       01/28/2020                          D.O.B.:  1996-05-10 (24 yrs)  Name:       [birth year] A Sefcik                   Visit Date: 01/26/2020 11:42 am ----------------------------------------------------------------------  Performed By  Performed By:     01/28/2020 BS,      Ref. Address:     Faculty                    RDMS  Attending:        Emeline Darling      Location[facility]                    MD                                       Fetal Care  Referred By:      Lin Landsman CNM ---------------------------------------------------------------------- Orders   #  Description                          Code         Ordered By   1  Sharyon Cable MFM OB FOLLOW UP                  [place]     VERONICA ROGERS  ----------------------------------------------------------------------   #  Order #                    Accession #                 Episode #   1  [id number]                  [id number]                  [id number]  ---------------------------------------------------------------------- Indications   Vaginal bleeding in pregnancy, third           O46.93   trimester x 2 weeks   [redacted] weeks gestation of pregnancy                Z3A.35   Encounter for other antenatal screening        Z36.2   follow-up  ---------------------------------------------------------------------- Vital Signs                                                 Height:        5'6" ---------------------------------------------------------------------- Fetal Evaluation  Num Of Fetuses:         1  Fetal Heart Rate(bpm):  157  Cardiac Activity:       Observed  Presentation:           Cephalic  Placenta:               Posterior  P. Cord Insertion:      Previously Visualized  Amniotic Fluid  AFI FV:      Within normal limits  AFI Sum(cm)     %Tile       Largest Pocket(cm)  15.29           55          6.01  RUQ(cm)                     LUQ(cm)        LLQ(cm)  4.23                        6.01           5.05  Comment:    No placental abruption identified on Ultrasound today. ---------------------------------------------------------------------- Biometry  BPD:      85.5  mm     G. Age:  34w 3d         36  %    CI:        73.53   %    70 - 86                                                           FL/HC:      20.6   %    20.1 - 22.3  HC:      316.8  mm     G. Age:  35w 4d         30  %    HC/AC:      0.96        0.93 - 1.11  AC:  329.2  mm     G. Age:  36w 6d         94  %    FL/BPD:     76.4   %    71 - 87  FL:       65.3  mm     G. Age:  33w 5d         13  %    FL/AC:      19.8   %    20 - 24  Est. FW:    2731  gm           6 lb     66  % ---------------------------------------------------------------------- OB History  Gravidity:    1 ---------------------------------------------------------------------- Gestational Age  U/S Today:     35w 1d                                        EDD:   02/29/20  Best:          35w 0d     Det. ByMarcella Dubs         EDD:   03/01/20                                      (07/26/19) ---------------------------------------------------------------------- Anatomy  Cranium:               Appears normal         Aortic Arch:            Appears normal  Cavum:                 Previously seen        Ductal Arch:            Appears normal  Ventricles:            Previously seen        Diaphragm:              Appears normal  Choroid Plexus:        Previously seen        Stomach:                Appears normal, left                                                                        sided  Cerebellum:            Previously seen        Abdomen:                Appears normal  Posterior Fossa:       Previously seen        Abdominal Wall:         Appears nml (cord  insert, abd wall)  Nuchal Fold:           Previously seen        Cord Vessels:           Appears normal (3                                                                        vessel cord)  Face:                  Orbits and profile     Kidneys:                Appear normal                         previously seen  Lips:                  Previously seen        Bladder:                Appears normal   Thoracic:              Appears normal         Spine:                  Appears normal  Heart:                 Appears normal         Upper Extremities:      Appears normal                         (4CH, axis, and                         situs)  RVOT:                  Appears normal         Lower Extremities:      Appears normal  LVOT:                  Appears normal  Other:  Heels and 5th digit visualized. Female gender. Nasal bone visualized.          Technically difficult due to fetal position. ---------------------------------------------------------------------- Cervix Uterus Adnexa  Cervix  Not visualized (advanced GA >24wks) ---------------------------------------------------------------------- Impression  Ms. Marner is here for follow up growth. She also complained  of bright red vaginal bleeding that has persisted on a weekly  basis since Christmas after a signficant fall.  Today she denies uterine tenderness or contractions. Today  we observed normal fetal growth, amniotic fluid and fetal  movement. There was no evidence of placental abruption or  previa.  I discussed the suggested diagnosis of chronic placental  abruption. I discussed the management to include inpatient  management or outpatient modified bed rest. She expressed  financial pressure and needing to work and would like to  avoid hospitalization at this time. However, she said she  would consider and speak with her work. I also recommend  delivery between 36-37 weeks if the bleeding persisted.  I spoke with  Ms. Steward Drone, CNM regarding this plan.  She will speak with Ms. Hedgecock and offer outpatient steroids as  well.  If Ms. Rabanal remains outpatient we have scheduled her to  return in 1 week for a BPP. ---------------------------------------------------------------------- Recommendations  Inpatient/outpatient rest  BMZ  Consider delivery between 36-37 weeks. ----------------------------------------------------------------------                Lin Landsman, MD Electronically Signed Final Report   01/26/2020 01:53 pm ----------------------------------------------------------------------   ASSESSMENT: Active Problems:   Vaginal bleeding during pregnancy   PLAN: Continue inpatient hospitalization until delivery or no further bleeding BPP had been scheduled for 2/3 Routine antenatal care  Catalina Antigua, MD 02/01/2020,9:49 AM

## 2020-02-02 ENCOUNTER — Inpatient Hospital Stay (HOSPITAL_COMMUNITY): Payer: Medicaid Other

## 2020-02-02 ENCOUNTER — Ambulatory Visit (HOSPITAL_COMMUNITY): Payer: Medicaid Other

## 2020-02-02 ENCOUNTER — Encounter: Payer: Medicaid Other | Admitting: Certified Nurse Midwife

## 2020-02-02 ENCOUNTER — Encounter (HOSPITAL_COMMUNITY): Payer: Self-pay

## 2020-02-02 DIAGNOSIS — Z3A36 36 weeks gestation of pregnancy: Secondary | ICD-10-CM

## 2020-02-02 DIAGNOSIS — O4693 Antepartum hemorrhage, unspecified, third trimester: Secondary | ICD-10-CM

## 2020-02-02 NOTE — Progress Notes (Signed)
Patient ID: Kristi Roth, female   DOB: 02-22-1996, 24 y.o.   MRN: [id number] [place]) NOTE  Kristi Roth is a 24 y.o. G1P0 at [redacted]w[redacted]d by best clinical estimate who is admitted for vaginal bleeding in the third trimester.   Fetal presentation is cephalic. Length of Stay:  6  Days  Subjective: Patient denies any further episodes of vaginal bleeding since yesterday morning Patient reports the fetal movement as active. Patient reports uterine contraction  activity as none. Patient reports  vaginal bleeding as none. Patient describes fluid per vagina as None.  Vitals:  Blood pressure 121/69, pulse 97, temperature 98.5 F (36.9 C), temperature source Oral, resp. rate 18, height 5\' 8"  (1.727 m), weight 89.8 kg, last menstrual period 05/26/2019, SpO2 97 %. Physical Examination:  General appearance - alert, well appearing, and in no distress Chest - normal effort Abdomen - gravid, non-tender Fundal Height:  size equals dates Extremities: Homans sign is negative, no sign of DVT  Membranes:intact  Fetal Monitoring:  Baseline: 150 bpm, Variability: Good {> 6 bpm), Accelerations: Reactive and Decelerations: Absent   Medications:  Scheduled . docusate sodium  100 mg Oral Daily  . polyethylene glycol  17 g Oral Daily  . prenatal multivitamin  1 tablet Oral Q1200   I have reviewed the patient's current medications.   Imaging: [place] MFM OB FOLLOW UP  Result Date: 01/26/2020 ----------------------------------------------------------------------  OBSTETRICS REPORT                       (Signed Final 01/26/2020 01:53 pm) ---------------------------------------------------------------------- Patient Info  ID #:       [id number]                          D.O.B.:  1996/09/28 (23 yrs)  Name:       Kristi Roth                   Visit Date: 01/26/2020 11:42 am ---------------------------------------------------------------------- Performed By  Performed By:     Jeanene Erb  BS,      Ref. Address:     Faculty                    RDMS  Attending:        Sander Nephew      Location[facility]                    MD                                       Fetal Care  Referred By:      Lajean Manes CNM ---------------------------------------------------------------------- Orders   #  Description                          Code         Ordered By   1  [place] MFM OB FOLLOW UP                  35573.22     Darrol Poke  ----------------------------------------------------------------------   #  Order #  Accession #                 Episode #   1  [id number]                  [id number]                  [id number]  ---------------------------------------------------------------------- Indications   Vaginal bleeding in pregnancy, third           O46.93   trimester x 2 weeks   [redacted] weeks gestation of pregnancy                Z3A.35   Encounter for other antenatal screening        Z36.2   follow-up  ---------------------------------------------------------------------- Vital Signs                                                 Height:        5'6" ---------------------------------------------------------------------- Fetal Evaluation  Num Of Fetuses:         1  Fetal Heart Rate(bpm):  157  Cardiac Activity:       Observed  Presentation:           Cephalic  Placenta:               Posterior  P. Cord Insertion:      Previously Visualized  Amniotic Fluid  AFI FV:      Within normal limits  AFI Sum(cm)     %Tile       Largest Pocket(cm)  15.29           55          6.01  RUQ(cm)                     LUQ(cm)        LLQ(cm)  4.23                        6.01           5.05  Comment:    No placental abruption identified on Ultrasound today. ---------------------------------------------------------------------- Biometry  BPD:      85.5  mm     G. Age:  34w 3d         36  %    CI:        73.53   %    70 - 86                                                           FL/HC:      20.6   %    20.1 - 22.3  HC:      316.8  mm     G. Age:  35w 4d         30  %    HC/AC:      0.96        0.93 - 1.11  AC:      329.2  mm     G. Age:  36w 6d  94  %    FL/BPD:     76.4   %    71 - 87  FL:       65.3  mm     G. Age:  33w 5d         13  %    FL/AC:      19.8   %    20 - 24  Est. FW:    2731  gm           6 lb     66  % ---------------------------------------------------------------------- OB History  Gravidity:    1 ---------------------------------------------------------------------- Gestational Age  U/S Today:     35w 1d                                        EDD:   02/29/20  Best:          35w 0d     Det. ByMarcella Dubs         EDD:   03/01/20                                      (07/26/19) ---------------------------------------------------------------------- Anatomy  Cranium:               Appears normal         Aortic Arch:            Appears normal  Cavum:                 Previously seen        Ductal Arch:            Appears normal  Ventricles:            Previously seen        Diaphragm:              Appears normal  Choroid Plexus:        Previously seen        Stomach:                Appears normal, left                                                                        sided  Cerebellum:            Previously seen        Abdomen:                Appears normal  Posterior Fossa:       Previously seen        Abdominal Wall:         Appears nml (cord  insert, abd wall)  Nuchal Fold:           Previously seen        Cord Vessels:           Appears normal (3                                                                        vessel cord)  Face:                  Orbits and profile     Kidneys:                Appear normal                         previously seen  Lips:                  Previously seen        Bladder:                Appears normal  Thoracic:              Appears normal          Spine:                  Appears normal  Heart:                 Appears normal         Upper Extremities:      Appears normal                         (4CH, axis, and                         situs)  RVOT:                  Appears normal         Lower Extremities:      Appears normal  LVOT:                  Appears normal  Other:  Heels and 5th digit visualized. Female gender. Nasal bone visualized.          Technically difficult due to fetal position. ---------------------------------------------------------------------- Cervix Uterus Adnexa  Cervix  Not visualized (advanced GA >24wks) ---------------------------------------------------------------------- Impression  Ms. Brunet is here for follow up growth. She also complained  of bright red vaginal bleeding that has persisted on a weekly  basis since Christmas after a signficant fall.  Today she denies uterine tenderness or contractions. Today  we observed normal fetal growth, amniotic fluid and fetal  movement. There was no evidence of placental abruption or  previa.  I discussed the suggested diagnosis of chronic placental  abruption. I discussed the management to include inpatient  management or outpatient modified bed rest. She expressed  financial pressure and needing to work and would like to  avoid hospitalization at this time. However, she said she  would consider and speak with her work. I also recommend  delivery between 36-37 weeks if the bleeding persisted.  I spoke with  Ms. Steward Drone, CNM regarding this plan.  She will speak with Ms. Dilley and offer outpatient steroids as  well.  If Ms. Allebach remains outpatient we have scheduled her to  return in 1 week for a BPP. ---------------------------------------------------------------------- Recommendations  Inpatient/outpatient rest  BMZ  Consider delivery between 36-37 weeks. ----------------------------------------------------------------------               Lin Landsman, MD Electronically Signed  Final Report   01/26/2020 01:53 pm ----------------------------------------------------------------------   ASSESSMENT: Active Problems:   Vaginal bleeding during pregnancy   PLAN: Continue inpatient hospitalization until delivery or no further bleeding BPP today Routine antenatal care  Catalina Antigua, MD 02/02/2020,10:18 AM

## 2020-02-03 ENCOUNTER — Encounter: Payer: Self-pay | Admitting: Certified Nurse Midwife

## 2020-02-03 ENCOUNTER — Ambulatory Visit (HOSPITAL_COMMUNITY): Payer: Medicaid Other

## 2020-02-03 NOTE — Progress Notes (Signed)
Attempted to follow up with patient per consult request for prayer, but patient was on the phone with her friend and requested a follow up visit in the morning, which I will provide.  Please page as further needs arise.  Maryanna Shape. Carley Hammed, M.Div. [facility] Pager [phone] Office [phone]

## 2020-02-03 NOTE — Progress Notes (Signed)
Patient ID: [name], female   DOB: Jun 25, 1996, 24 y.o.   MRN: [id number] FACULTY PRACTICE ANTEPARTUM(COMPREHENSIVE) NOTE  SONDI DESCH is a 24 y.o. G1P0 at [redacted]w[redacted]d by best clinical estimate who is admitted for vaginal bleeding in the third trimester.   Fetal presentation is cephalic. Length of Stay:  7  Days  Subjective: Patient denies any further episodes of vaginal bleeding and reports occasional contractions Patient reports the fetal movement as active. Patient reports uterine contraction  activity as irregular. Patient reports  vaginal bleeding as none. Patient describes fluid per vagina as None.  Vitals:  Blood pressure 122/61, pulse 100, temperature 98.2 F (36.8 C), temperature source Oral, resp. rate 18, height 5\' 8"  (1.727 m), weight 89.8 kg, last menstrual period 05/26/2019, SpO2 100 %. Physical Examination:  General appearance - alert, well appearing, and in no distress Chest - normal effort Abdomen - gravid, non-tender Fundal Height:  size equals dates Extremities: Homans sign is negative, no sign of DVT  Membranes:intact  Fetal Monitoring:  Baseline: 160 bpm, Variability: Good {> 6 bpm), Accelerations: Reactive and Decelerations: Absent   Medications:  Scheduled . docusate sodium  100 mg Oral Daily  . polyethylene glycol  17 g Oral Daily  . prenatal multivitamin  1 tablet Oral Q1200   I have reviewed the patient's current medications.   Imaging: 05/28/2019 MFM FETAL BPP WO NON STRESS  Result Date: 02/02/2020 ----------------------------------------------------------------------  OBSTETRICS REPORT                       (Signed Final 02/02/2020 11:15 am) ---------------------------------------------------------------------- Patient Info  ID #:       04/01/2020                          D.O.B.:  [birth year] (24 yrs)  Name:       [birth year] Vega                   Visit Date: 02/02/2020 08:52 am ---------------------------------------------------------------------- Performed By   Performed By:     04/01/2020 BS,      Ref. Address:     Faculty                    RDMS  Attending:        Emeline Darling MD        [facility]  Referred By:      Noralee Space CNM ---------------------------------------------------------------------- Orders   #  Description                          Code         Ordered By   1  Sharyon Cable MFM FETAL BPP WO NON              76819.01     UGONNA ANYANWU  STRESS  ----------------------------------------------------------------------   #  Order #                    Accession #                 Episode #   1  [id number]                  [id number]                  [id number]  ---------------------------------------------------------------------- Indications   Vaginal bleeding in pregnancy, third trimester O46.93   Low Risk NIPS   [redacted] weeks gestation of pregnancy                Z3A.36  ---------------------------------------------------------------------- Vital Signs                                                 Height:        5'6" ---------------------------------------------------------------------- Fetal Evaluation  Num Of Fetuses:         1  Fetal Heart Rate(bpm):  158  Cardiac Activity:       Observed  Presentation:           Cephalic  Placenta:               Posterior  P. Cord Insertion:      Previously Visualized  Amniotic Fluid  AFI FV:      Within normal limits  AFI Sum(cm)     %Tile       Largest Pocket(cm)  13.37           47          4.96  RUQ(cm)                     LUQ(cm)        LLQ(cm)  4.37                        4.96           4.04  Comment:    No placental abruption identified. ---------------------------------------------------------------------- Biophysical Evaluation  Amniotic F.V:   Pocket => 2 cm             F. Tone:        Observed  F. Movement:    Observed                   Score:          6/8  F. Breathing:   Not Observed  ---------------------------------------------------------------------- OB History  Gravidity:    1 ---------------------------------------------------------------------- Gestational Age  Best:          36w 0d     Det. ByMarcella Dubs         EDD:   03/01/20                                      (07/26/19) ---------------------------------------------------------------------- Impression  Patient was admitted last week with complaints of vaginal  bleeding.  She does not report vaginal bleeding now.  Amniotic fluid is normal and good fetal activity is seen. Fetal  breathing movements did not meet the criteria of BPP.  BPP 6/8.  I reviewed the NST and it is reactive. ---------------------------------------------------------------------- Recommendations  -Repeat BPP in 3 to 4 days or if NST is nonreactive. ----------------------------------------------------------------------                  Tama High, MD Electronically Signed Final Report   02/02/2020 11:15 am ----------------------------------------------------------------------  [place] MFM OB FOLLOW UP  Result Date: 01/26/2020 ----------------------------------------------------------------------  OBSTETRICS REPORT                       (Signed Final 01/26/2020 01:53 pm) ---------------------------------------------------------------------- Patient Info  ID #:       [id number]                          D.O.B.:  Jul 16, 1996 (24 yrs)  Name:       [name]                   Visit Date: 01/26/2020 11:42 am ---------------------------------------------------------------------- Performed By  Performed By:     Jeanene Erb BS,      Ref. Address:     Faculty                    RDMS  Attending:        Sander Nephew      [facility]  Referred By:      Lajean Manes CNM ---------------------------------------------------------------------- Orders    #  Description                          Code         Ordered By   1  [place] MFM OB FOLLOW UP                  34193.79     Darrol Poke  ----------------------------------------------------------------------   #  Order #                    Accession #                 Episode #   1  [id number]                  [id number]                  [id number]  ---------------------------------------------------------------------- Indications   Vaginal bleeding in pregnancy, third           O46.93   trimester x 2 weeks   [redacted] weeks gestation of pregnancy                Z3A.35   Encounter for other antenatal screening        Z36.2   follow-up  ---------------------------------------------------------------------- Vital Signs  Height:        5'6" ---------------------------------------------------------------------- Fetal Evaluation  Num Of Fetuses:         1  Fetal Heart Rate(bpm):  157  Cardiac Activity:       Observed  Presentation:           Cephalic  Placenta:               Posterior  P. Cord Insertion:      Previously Visualized  Amniotic Fluid  AFI FV:      Within normal limits  AFI Sum(cm)     %Tile       Largest Pocket(cm)  15.29           55          6.01  RUQ(cm)                     LUQ(cm)        LLQ(cm)  4.23                        6.01           5.05  Comment:    No placental abruption identified on Ultrasound today. ---------------------------------------------------------------------- Biometry  BPD:      85.5  mm     G. Age:  34w 3d         36  %    CI:        73.53   %    70 - 86                                                          FL/HC:      20.6   %    20.1 - 22.3  HC:      316.8  mm     G. Age:  35w 4d         30  %    HC/AC:      0.96        0.93 - 1.11  AC:      329.2  mm     G. Age:  36w 6d         94  %    FL/BPD:     76.4   %    71 - 87  FL:       65.3  mm     G. Age:  33w 5d         13  %    FL/AC:      19.8   %    20 - 24  Est. FW:    2731  gm           6  lb     66  % ---------------------------------------------------------------------- OB History  Gravidity:    1 ---------------------------------------------------------------------- Gestational Age  U/S Today:     35w 1d                                        EDD:   02/29/20  Best:          35w 0d     Det.  ByLoman Chroman         EDD:   03/01/20                                      (07/26/19) ---------------------------------------------------------------------- Anatomy  Cranium:               Appears normal         Aortic Arch:            Appears normal  Cavum:                 Previously seen        Ductal Arch:            Appears normal  Ventricles:            Previously seen        Diaphragm:              Appears normal  Choroid Plexus:        Previously seen        Stomach:                Appears normal, left                                                                        sided  Cerebellum:            Previously seen        Abdomen:                Appears normal  Posterior Fossa:       Previously seen        Abdominal Wall:         Appears nml (cord                                                                        insert, abd wall)  Nuchal Fold:           Previously seen        Cord Vessels:           Appears normal (3                                                                        vessel cord)  Face:                  Orbits and profile     Kidneys:                Appear normal  previously seen  Lips:                  Previously seen        Bladder:                Appears normal  Thoracic:              Appears normal         Spine:                  Appears normal  Heart:                 Appears normal         Upper Extremities:      Appears normal                         (4CH, axis, and                         situs)  RVOT:                  Appears normal         Lower Extremities:      Appears normal  LVOT:                  Appears normal  Other:  Heels and 5th  digit visualized. Female gender. Nasal bone visualized.          Technically difficult due to fetal position. ---------------------------------------------------------------------- Cervix Uterus Adnexa  Cervix  Not visualized (advanced GA >24wks) ---------------------------------------------------------------------- Impression  Ms. Goodspeed is here for follow up growth. She also complained  of bright red vaginal bleeding that has persisted on a weekly  basis since Christmas after a signficant fall.  Today she denies uterine tenderness or contractions. Today  we observed normal fetal growth, amniotic fluid and fetal  movement. There was no evidence of placental abruption or  previa.  I discussed the suggested diagnosis of chronic placental  abruption. I discussed the management to include inpatient  management or outpatient modified bed rest. She expressed  financial pressure and needing to work and would like to  avoid hospitalization at this time. However, she said she  would consider and speak with her work. I also recommend  delivery between 36-37 weeks if the bleeding persisted.  I spoke with Ms. Steward Drone, CNM regarding this plan.  She will speak with Ms. Chaney and offer outpatient steroids as  well.  If Ms. Pouncey remains outpatient we have scheduled her to  return in 1 week for a BPP. ---------------------------------------------------------------------- Recommendations  Inpatient/outpatient rest  BMZ  Consider delivery between 36-37 weeks. ----------------------------------------------------------------------               Lin Landsman, MD Electronically Signed Final Report   01/26/2020 01:53 pm ----------------------------------------------------------------------   ASSESSMENT: Active Problems:   Vaginal bleeding during pregnancy   PLAN: Continue inpatient hospitalization until delivery or no further bleeding BPP yesterday 6/8- overall 8/10 Routine antenatal care  Catalina Antigua,  MD 02/03/2020,10:16 AM

## 2020-02-03 NOTE — Progress Notes (Signed)
Initial Nutrition Assessment  DOCUMENTATION CODES:   None applicable  INTERVENTION:  Regular Diet Pt may request snacks TID and double protein portions  NUTRITION DIAGNOSIS:  Increased nutrient needs related to (pregnancy and fetal growth requirements) as evidenced by (36 weeks IUP).  GOAL:  Patient will meet greater than or equal to 90% of their needs  MONITOR:  Weight trends  REASON FOR ASSESSMENT:  Antenatal   ASSESSMENT:  36 1/7 weeks, adm due to vaginal bleeding. Wt at 13 weeks, 149 lbs, BMI 23. 48 lb weight gain   Diet Order:   Diet Order            Diet regular Room service appropriate? Yes; Fluid consistency: Thin  Diet effective now             EDUCATION NEEDS:   No education needs have been identified at this time  Skin:  Skin Assessment: Reviewed RN Assessment  Height:   Ht Readings from Last 1 Encounters:  01/27/20 5\' 8"  (1.727 m)   Weight:   Wt Readings from Last 1 Encounters:  01/27/20 89.8 kg   Ideal Body Weight:   140 lbs  BMI:  Body mass index is 30.11 kg/m.  Estimated Nutritional Needs:   Kcal:  1850 -2050  Protein:  83-93 g  Fluid:  2.2 L    01/29/20 M.M LDN Neonatal Nutrition Support Specialist/RD III

## 2020-02-04 ENCOUNTER — Encounter (HOSPITAL_COMMUNITY): Payer: Self-pay | Admitting: Obstetrics & Gynecology

## 2020-02-04 LAB — TYPE AND SCREEN
ABO/RH(D): AB POS
Antibody Screen: NEGATIVE

## 2020-02-04 MED ORDER — CYCLOBENZAPRINE HCL 10 MG PO TABS: 10.0000 mg | ORAL_TABLET | Freq: Three times a day (TID) | ORAL | 0 refills | Status: DC | PRN

## 2020-02-04 NOTE — Discharge Instructions (Signed)
Vaginal Bleeding During Pregnancy, Third Trimester  A small amount of bleeding from the vagina (spotting) is relatively common during pregnancy. Various things can cause bleeding or spotting during pregnancy. Sometimes bleeding is normal and is not a problem. However, bleeding during the third trimester can also be a sign of something serious for the mother and the baby. Be sure to tell your health care provider about any vaginal bleeding right away. Some possible causes of vaginal bleeding during the third trimester include:  Infection or growths (polyps) on the cervix.  A condition in which the placenta partially or completely covers the opening of the cervix inside the uterus (placenta previa).  The placenta separating from the uterus (placenta abruption).  The start of labor (discharging of the mucus plug).  A condition in which the placenta grows into the muscle layer of the uterus (placenta accreta). Follow these instructions at home: Activity  Follow instructions from your health care provider about limiting your activity. If your health care provider recommends activity restriction, you may need to stay in bed and only get up to use the bathroom. In some cases, your health care provider may allow you to continue light activity.  If needed, make plans for someone to help with your regular activities.  Ask your health care provider if it is safe for you to drive.  Do not lift anything that is heavier than 10 lb (4.5 kg), or the limit that your health care provider tells you, until he or she says that it is safe.  Do not have sex or orgasms until your health care provider says that this is safe. Medicines  Take over-the-counter and prescription medicines only as told by your health care provider.  Do not take aspirin because it can cause bleeding. General instructions  Pay attention to any changes in your symptoms.  Write down how many pads you use each day, how often you  change pads, and how soaked (saturated) they are.  Do not use tampons or douche.  If you pass any tissue from your vagina, save the tissue so you can show it to your health care provider.  Keep all follow-up visits as told by your health care provider. This is important. Contact a health care provider if:  You have vaginal bleeding during any part of your pregnancy.  You have cramps or labor pains.  You have a fever. Get help right away if:  You have severe cramps or pain in your back or abdomen.  You have a gush of fluid from the vagina.  You pass large clots or a large amount of tissue from your vagina.  Your bleeding increases.  You feel light-headed or weak.  You faint.  You feel that your baby is moving less than usual, or not moving at all. Summary  Various things can cause bleeding or spotting in pregnancy.  Bleeding during the third trimester can be a sign of a serious problem for the mother and the baby.  Be sure to tell your health care provider about any vaginal bleeding right away. This information is not intended to replace advice given to you by your health care provider. Make sure you discuss any questions you have with your health care provider. Document Revised: 04/06/2019 Document Reviewed: 03/20/2017 Elsevier Patient Education  2020 [employer]  

## 2020-02-04 NOTE — Progress Notes (Deleted)
NICU charge RN, Neonatologist, and OB OR charge nurse notified by Dr. Morene Antu of caesarean section being performed.

## 2020-02-04 NOTE — Progress Notes (Signed)
Patient decided she would like to remain in-patient. Dr. Marice Potter notified and d/c order canceled.

## 2020-02-04 NOTE — Plan of Care (Signed)
  Problem: Education: Goal: Knowledge of the prescribed therapeutic regimen will improve Outcome: Progressing   

## 2020-02-04 NOTE — Progress Notes (Signed)
Patient ID: Kristi Roth, female   DOB: 05-27-1996, 24 y.o.   MRN: [id number] Kristi Roth is a 24 y.o. G1P0 at [redacted]w[redacted]d by best clinical estimate who is admitted for vaginal bleeding in the third trimester.   Fetal presentation is cephalic. Length of Stay:  8 Days  Subjective: Patient denies any further episodes of vaginal bleeding and reports occasional contractions Patient reports the fetal movement as active. Patient reports uterine contraction  activity as irregular. Patient reports  vaginal bleeding as none. Patient describes fluid per vagina as None.  Vitals:  Blood pressure 122/61, pulse 100, temperature 98.2 F (36.8 C), temperature source Oral, resp. rate 18, height 5\' 8"  (1.727 m), weight 89.8 kg, last menstrual period 05/26/2019, SpO2 100 %. Physical Examination:  General appearance - alert, well appearing, and in no distress Chest - normal effort Abdomen - gravid, non-tender Fundal Height:  size equals dates Extremities: Homans sign is negative, no sign of DVT  Membranes:intact  Fetal Monitoring:  Baseline: 160 bpm, Variability: Good {> 6 bpm), Accelerations: Reactive and Decelerations: Absent   Medications:  Scheduled . docusate sodium  100 mg Oral Daily  . polyethylene glycol  17 g Oral Daily  . prenatal multivitamin  1 tablet Oral Q1200   I have reviewed the patient's current medications.   Imaging:  Imaging Results  [place] MFM FETAL BPP WO NON STRESS  Result Date: 02/02/2020 ----------------------------------------------------------------------  OBSTETRICS REPORT                       (Signed Final 02/02/2020 11:15 am) ---------------------------------------------------------------------- Patient Info  ID #:       [id number]                          D.O.B.:  04-Feb-1996 (24 yrs)  Name:       Kristi Roth                   Visit Date: 02/02/2020 08:52 am ---------------------------------------------------------------------- Performed By  Performed By:     Emeline Darling BS,      Ref. Address:     Faculty                    RDMS  Attending:        Noralee Space MD        [facility]  Referred By:      Sharyon Cable CNM ---------------------------------------------------------------------- Orders   #  Description                          Code         Ordered By   1  [place] MFM FETAL BPP WO NON              36122.44     UGONNA ANYANWU      STRESS  ----------------------------------------------------------------------   #  Order #                    Accession #                 Episode #   1  [id number]                  [id number]                  [id number]  ---------------------------------------------------------------------- Indications   Vaginal bleeding in pregnancy, third trimester O46.93   Low Risk NIPS   [redacted] weeks gestation of pregnancy                Z3A.36  ---------------------------------------------------------------------- Vital Signs                                                 Height:        5'6" ---------------------------------------------------------------------- Fetal Evaluation  Num Of Fetuses:         1  Fetal Heart Rate(bpm):  158  Cardiac Activity:       Observed  Presentation:           Cephalic  Placenta:               Posterior  P. Cord Insertion:      Previously Visualized  Amniotic Fluid  AFI FV:      Within normal limits  AFI Sum(cm)     %Tile       Largest Pocket(cm)  13.37           47          4.96  RUQ(cm)                     LUQ(cm)        LLQ(cm)  4.37                        4.96           4.04  Comment:    No placental abruption identified. ---------------------------------------------------------------------- Biophysical Evaluation  Amniotic F.V:   Pocket => 2 cm             F. Tone:        Observed  F. Movement:    Observed                   Score:          6/8  F. Breathing:   Not Observed  ---------------------------------------------------------------------- OB History  Gravidity:    1 ---------------------------------------------------------------------- Gestational Age  Best:          36w 0d     Det. ByMarcella Dubs         EDD:   03/01/20                                      (07/26/19) ---------------------------------------------------------------------- Impression  Patient was admitted last week with complaints of vaginal  bleeding.  She does not report vaginal bleeding now.  Amniotic fluid is normal and good fetal activity is seen. Fetal  breathing movements did not meet the criteria of BPP.  BPP 6/8.  I reviewed the NST  and it is reactive. ---------------------------------------------------------------------- Recommendations  -Repeat BPP in 3 to 4 days or if NST is nonreactive. ----------------------------------------------------------------------                  Noralee Space, MD Electronically Signed Final Report   02/02/2020 11:15 am ----------------------------------------------------------------------  [place] MFM OB FOLLOW UP  Result Date: 01/26/2020 ----------------------------------------------------------------------  OBSTETRICS REPORT                       (Signed Final 01/26/2020 01:53 pm) ---------------------------------------------------------------------- Patient Info  ID #:       [id number]                          D.O.B.:  December 07, 1996 (24 yrs)  Name:       Kristi Roth                   Visit Date: 01/26/2020 11:42 am ---------------------------------------------------------------------- Performed By  Performed By:     Emeline Darling BS,      Ref. Address:     Faculty                    RDMS  Attending:        Lin Landsman      [facility]  Referred By:      Sharyon Cable CNM ----------------------------------------------------------------------  Orders   #  Description                          Code         Ordered By   1  [place] MFM OB FOLLOW UP                  21308.65     Steward Drone  ----------------------------------------------------------------------   #  Order #                    Accession #                 Episode #   1  [id number]                  [id number]                  [id number]  ---------------------------------------------------------------------- Indications   Vaginal bleeding in pregnancy, third           O46.93   trimester x 2 weeks   [redacted] weeks gestation of pregnancy                Z3A.35   Encounter for other antenatal screening        Z36.2   follow-up  ---------------------------------------------------------------------- Vital Signs  Height:        5'6" ---------------------------------------------------------------------- Fetal Evaluation  Num Of Fetuses:         1  Fetal Heart Rate(bpm):  157  Cardiac Activity:       Observed  Presentation:           Cephalic  Placenta:               Posterior  P. Cord Insertion:      Previously Visualized  Amniotic Fluid  AFI FV:      Within normal limits  AFI Sum(cm)     %Tile       Largest Pocket(cm)  15.29           55          6.01  RUQ(cm)                     LUQ(cm)        LLQ(cm)  4.23                        6.01           5.05  Comment:    No placental abruption identified on Ultrasound today. ---------------------------------------------------------------------- Biometry  BPD:      85.5  mm     G. Age:  34w 3d         36  %    CI:        73.53   %    70 - 86                                                          FL/HC:      20.6   %    20.1 - 22.3  HC:      316.8  mm     G. Age:  35w 4d         30  %    HC/AC:      0.96        0.93 - 1.11  AC:      329.2  mm     G. Age:  36w 6d         94  %    FL/BPD:     76.4   %    71 - 87  FL:       65.3  mm     G. Age:  33w 5d         13  %    FL/AC:      19.8   %    20 - 24  Est. FW:    2731  gm            6 lb     66  % ---------------------------------------------------------------------- OB History  Gravidity:    1 ---------------------------------------------------------------------- Gestational Age  U/S Today:     35w 1d                                        EDD:   02/29/20  Best:          35w 0d     Det.  ByLoman Chroman         EDD:   03/01/20                                      (07/26/19) ---------------------------------------------------------------------- Anatomy  Cranium:               Appears normal         Aortic Arch:            Appears normal  Cavum:                 Previously seen        Ductal Arch:            Appears normal  Ventricles:            Previously seen        Diaphragm:              Appears normal  Choroid Plexus:        Previously seen        Stomach:                Appears normal, left                                                                        sided  Cerebellum:            Previously seen        Abdomen:                Appears normal  Posterior Fossa:       Previously seen        Abdominal Wall:         Appears nml (cord                                                                        insert, abd wall)  Nuchal Fold:           Previously seen        Cord Vessels:           Appears normal (3                                                                        vessel cord)  Face:                  Orbits and profile     Kidneys:                Appear normal  previously seen  Lips:                  Previously seen        Bladder:                Appears normal  Thoracic:              Appears normal         Spine:                  Appears normal  Heart:                 Appears normal         Upper Extremities:      Appears normal                         (4CH, axis, and                         situs)  RVOT:                  Appears normal         Lower Extremities:      Appears normal  LVOT:                  Appears normal  Other:  Heels and  5th digit visualized. Female gender. Nasal bone visualized.          Technically difficult due to fetal position. ---------------------------------------------------------------------- Cervix Uterus Adnexa  Cervix  Not visualized (advanced GA >24wks) ---------------------------------------------------------------------- Impression  Ms. Greenleaf is here for follow up growth. She also complained  of bright red vaginal bleeding that has persisted on a weekly  basis since Christmas after a signficant fall.  Today she denies uterine tenderness or contractions. Today  we observed normal fetal growth, amniotic fluid and fetal  movement. There was no evidence of placental abruption or  previa.  I discussed the suggested diagnosis of chronic placental  abruption. I discussed the management to include inpatient  management or outpatient modified bed rest. She expressed  financial pressure and needing to work and would like to  avoid hospitalization at this time. However, she said she  would consider and speak with her work. I also recommend  delivery between 36-37 weeks if the bleeding persisted.  I spoke with Ms. Steward Drone, CNM regarding this plan.  She will speak with Ms. Fike and offer outpatient steroids as  well.  If Ms. Jamroz remains outpatient we have scheduled her to  return in 1 week for a BPP. ---------------------------------------------------------------------- Recommendations  Inpatient/outpatient rest  BMZ  Consider delivery between 36-37 weeks. ----------------------------------------------------------------------               Lin Landsman, MD Electronically Signed Final Report   01/26/2020 01:53 pm ----------------------------------------------------------------------    ASSESSMENT: Active Problems:   Vaginal bleeding during pregnancy   PLAN: Continue inpatient hospitalization until delivery or no further bleeding BPP on 02/02/20 was 6/8- overall 8/10 Routine antenatal care Delivery  around 37 weeks

## 2020-02-05 LAB — CBC
HCT: 36.5 % (ref 36.0–46.0)
Hemoglobin: 12.5 g/dL (ref 12.0–15.0)
MCH: 30.9 pg (ref 26.0–34.0)
MCHC: 34.2 g/dL (ref 30.0–36.0)
MCV: 90.3 fL (ref 80.0–100.0)
Platelets: 224 10*3/uL (ref 150–400)
RBC: 4.04 MIL/uL (ref 3.87–5.11)
RDW: 12.3 % (ref 11.5–15.5)
WBC: 8.4 10*3/uL (ref 4.0–10.5)
nRBC: 0 % (ref 0.0–0.2)

## 2020-02-05 MED ORDER — FLUCONAZOLE 150 MG PO TABS
150.0000 mg | ORAL_TABLET | Freq: Once | ORAL | Status: AC
  Administered 2020-02-05: 13:00:00 150 mg via ORAL
  Filled 2020-02-05: qty 1

## 2020-02-05 NOTE — Plan of Care (Signed)
  Problem: Education: Goal: Knowledge of the prescribed therapeutic regimen will improve Outcome: Progressing   

## 2020-02-05 NOTE — Progress Notes (Addendum)
Patient ID: Kristi Roth, female   DOB: 19-Oct-1996, 24 y.o.   MRN: [id number] Kristi Roth is a 24 y.o. G1P0 at [redacted]w[redacted]d by best clinical estimate who is admitted for vaginal bleeding in the third trimester.  Fetal presentation is cephalic.  Length of Stay: 9 Days  Subjective:  Patient denies any further episodes of vaginal bleeding and reports occasional contractions  Patient reports the fetal movement as active.  Patient reports uterine contraction activity as irregular.  Patient reports vaginal bleeding as none.  Patient describes fluid per vagina as None.  VSS, AF FHR- reassuring  Physical Examination:  General appearance - alert, well appearing, and in no distress  Chest - normal effort  Abdomen - gravid, non-tender  Fundal Height: size equals dates  Extremities: Homans sign is negative, no sign of DVT  Membranes:intact  Fetal Monitoring: Baseline: 160 bpm, Variability: Good {> 6 bpm), Accelerations: Reactive and Decelerations: Absent  Medications: Scheduled  . docusate sodium 100 mg Oral Daily  . polyethylene glycol 17 g Oral Daily  . prenatal multivitamin 1 tablet Oral Q1200  I have reviewed the patient's current medications.  Imaging:  Imaging Results  [place] MFM FETAL BPP WO NON STRESS  Result Date: 02/02/2020  ---------------------------------------------------------------------- OBSTETRICS REPORT (Signed Final 02/02/2020 11:15 am) ---------------------------------------------------------------------- Patient Info ID #: [id number] D.O.B.: 06/16/1996 (24 yrs) Name: Kristi Roth Visit Date: 02/02/2020 08:52 am ---------------------------------------------------------------------- Performed By Performed By: Jeanene Erb BS, Ref. Address: Faculty RDMS Attending: Tama High MD [facility] Referred By: Lajean Manes CNM ---------------------------------------------------------------------- Orders # Description Code Ordered By 1 [place] MFM FETAL BPP WO NON 76819.01  UGONNA ANYANWU STRESS ---------------------------------------------------------------------- # Order # Accession # Episode # [id number] ---------------------------------------------------------------------- Indications Vaginal bleeding in pregnancy, third trimester O46.93 Low Risk NIPS [redacted] weeks gestation of pregnancy Z3A.36 ---------------------------------------------------------------------- Vital Signs Height: 5'6" ---------------------------------------------------------------------- Fetal Evaluation Num Of Fetuses: 1 Fetal Heart Rate(bpm): 158 Cardiac Activity: Observed Presentation: Cephalic Placenta: Posterior P. Cord Insertion: Previously Visualized Amniotic Fluid AFI FV: Within normal limits AFI Sum(cm) %Tile Largest Pocket(cm) 13.37 47 4.96 RUQ(cm) LUQ(cm) LLQ(cm) 4.37 4.96 4.04 Comment: No placental abruption identified. ---------------------------------------------------------------------- Biophysical Evaluation Amniotic F.V: Pocket => 2 cm F. Tone: Observed F. Movement: Observed Score: 6/8 F. Breathing: Not Observed ---------------------------------------------------------------------- OB History Gravidity: 1 ---------------------------------------------------------------------- Gestational Age Best: 36w 0d Det. ByLoman Chroman EDD: 03/01/20 (07/26/19) ---------------------------------------------------------------------- Impression Patient was admitted last week with complaints of vaginal bleeding. She does not report vaginal bleeding now. Amniotic fluid is normal and good fetal activity is seen. Fetal breathing movements did not meet the criteria of BPP. BPP 6/8. I reviewed the NST and it is reactive. ---------------------------------------------------------------------- Recommendations -Repeat BPP in 3 to 4 days or if NST is nonreactive. ---------------------------------------------------------------------- Tama High, MD Electronically Signed Final Report 02/02/2020  11:15 am ----------------------------------------------------------------------  [place] MFM OB FOLLOW UP  Result Date: 01/26/2020  ---------------------------------------------------------------------- OBSTETRICS REPORT (Signed Final 01/26/2020 01:53 pm) ---------------------------------------------------------------------- Patient Info ID #: [id number] D.O.B.: 1996/08/21 (24 yrs) Name: Kristi Roth Visit Date: 01/26/2020 11:42 am ---------------------------------------------------------------------- Performed By Performed By: Jeanene Erb BS, Ref. Address: Faculty RDMS Attending: Sander Nephew [facility] Referred By: Lajean Manes CNM ---------------------------------------------------------------------- Orders # Description Code Ordered By 1 [place] MFM OB FOLLOW UP 73220.25 Darrol Poke ---------------------------------------------------------------------- # Order # Accession # Episode # 1 [id number] ---------------------------------------------------------------------- Indications Vaginal bleeding in pregnancy, third O46.93 trimester x 2 weeks [redacted] weeks gestation of pregnancy Z3A.35 Encounter  for other antenatal screening Z36.2 follow-up ---------------------------------------------------------------------- Vital Signs Height: 5'6" ---------------------------------------------------------------------- Fetal Evaluation Num Of Fetuses: 1 Fetal Heart Rate(bpm): 157 Cardiac Activity: Observed Presentation: Cephalic Placenta: Posterior P. Cord Insertion: Previously Visualized Amniotic Fluid AFI FV: Within normal limits AFI Sum(cm) %Tile Largest Pocket(cm) 15.29 55 6.01 RUQ(cm) LUQ(cm) LLQ(cm) 4.23 6.01 5.05 Comment: No placental abruption identified on Ultrasound today. ---------------------------------------------------------------------- Biometry BPD: 85.5 mm G. Age: 34w 3d 36 % CI: 73.53 % 70 - 86 FL/HC: 20.6 % 20.1 - 22.3 HC: 316.8 mm G. Age: 35w 4d 30 %  HC/AC: 0.96 0.93 - 1.11 AC: 329.2 mm G. Age: 36w 6d 94 % FL/BPD: 76.4 % 71 - 87 FL: 65.3 mm G. Age: 33w 5d 13 % FL/AC: 19.8 % 20 - 24 Est. FW: 2731 gm 6 lb 66 % ---------------------------------------------------------------------- OB History Gravidity: 1 ---------------------------------------------------------------------- Gestational Age U/S Today: 35w 1d EDD: 02/29/20 Best: 35w 0d Det. By: Marcella Dubs EDD: 03/01/20 (07/26/19) ---------------------------------------------------------------------- Anatomy Cranium: Appears normal Aortic Arch: Appears normal Cavum: Previously seen Ductal Arch: Appears normal Ventricles: Previously seen Diaphragm: Appears normal Choroid Plexus: Previously seen Stomach: Appears normal, left sided Cerebellum: Previously seen Abdomen: Appears normal Posterior Fossa: Previously seen Abdominal Wall: Appears nml (cord insert, abd wall) Nuchal Fold: Previously seen Cord Vessels: Appears normal (3 vessel cord) Face: Orbits and profile Kidneys: Appear normal previously seen Lips: Previously seen Bladder: Appears normal Thoracic: Appears normal Spine: Appears normal Heart: Appears normal Upper Extremities: Appears normal (4CH, axis, and situs) RVOT: Appears normal Lower Extremities: Appears normal LVOT: Appears normal Other: Heels and 5th digit visualized. Female gender. Nasal bone visualized. Technically difficult due to fetal position. ---------------------------------------------------------------------- Cervix Uterus Adnexa Cervix Not visualized (advanced GA >24wks) ---------------------------------------------------------------------- Impression Ms. Mcginniss is here for follow up growth. She also complained of bright red vaginal bleeding that has persisted on a weekly basis since Christmas after a signficant fall. Today she denies uterine tenderness or contractions. Today we observed normal fetal growth, amniotic fluid and fetal movement. There was no evidence of placental abruption or  previa. I discussed the suggested diagnosis of chronic placental abruption. I discussed the management to include inpatient management or outpatient modified bed rest. She expressed financial pressure and needing to work and would like to avoid hospitalization at this time. However, she said she would consider and speak with her work. I also recommend delivery between 36-37 weeks if the bleeding persisted. I spoke with Ms. Steward Drone, CNM regarding this plan. She will speak with Ms. Spatz and offer outpatient steroids as well. If Ms. Knopf remains outpatient we have scheduled her to return in 1 week for a BPP. ---------------------------------------------------------------------- Recommendations Inpatient/outpatient rest BMZ Consider delivery between 36-37 weeks. ---------------------------------------------------------------------- Lin Landsman, MD Electronically Signed Final Report 01/26/2020 01:53 pm ----------------------------------------------------------------------  ASSESSMENT:  Active Problems:  Vaginal bleeding during pregnancy  PLAN:  Continue inpatient hospitalization until delivery or no further bleeding  BPP on 02/02/20 was 6/8- overall 8/10  Routine antenatal care  Delivery around 37 weeks

## 2020-02-06 MED ORDER — HYDROCORTISONE (PERIANAL) 2.5 % EX CREA
TOPICAL_CREAM | Freq: Two times a day (BID) | CUTANEOUS | Status: DC
  Filled 2020-02-06: qty 28.35

## 2020-02-06 NOTE — Progress Notes (Signed)
Patient sleeping. Will come back later to round    

## 2020-02-06 NOTE — Progress Notes (Signed)
ANTE (Admitted 1/28) Subjective: Patient reports feeling well today. Some soreness in bottom and back but otherwise denies pain or bleeding. Feeling baby move. She would like to stay until 37 weeks.   Objective: Blood pressure 133/69, pulse 95, temperature 97.7 F (36.5 C), temperature source Oral, resp. rate 18, height 5\' 8"  (1.727 m), weight 89.8 kg, last menstrual period 05/26/2019, SpO2 99 %.  Physical Exam:  CONSTITUTIONAL: Well-developed, well-nourished female in no acute distress.  HEENT:  Normocephalic, atraumatic. External right and left ear normal. No scleral icterus.  NECK: Normal range of motion SKIN: No rash noted. Not diaphoretic. No erythema. No pallor. MUSCULOSKELETAL: Normal range of motion. No edema noted. NEUROLOGIC: Alert and oriented to person, place, and time. Normal muscle tone coordination. No cranial nerve deficit noted. PSYCHIATRIC: Normal mood and affect. Normal behavior. Normal judgment and thought content. CARDIOVASCULAR: Normal heart rate noted RESPIRATORY: Effort  normal, no problems with respiration noted ABDOMEN: Soft, non-tender. Gravid uterus   Recent Labs    02/05/20 1241  HGB 12.5  HCT 36.5    Assessment/Plan: Vaginal bleeding during pregnancy Will plan to stay inpatient until 37w and next BPP by MFM at that time BPPon 02/02/20 was 6/8- overall 8/10 Routine Antenatal Care Plan for delivery around 37w or per MFM  Patient prefers to not be induced and would possibly like to DC at 37w and come back in labor or at 39w for IOL; will defer to MFM   LOS: 10 days   8/8 02/06/2020, 10:35 AM

## 2020-02-07 LAB — TYPE AND SCREEN
ABO/RH(D): AB POS
Antibody Screen: NEGATIVE

## 2020-02-07 NOTE — Progress Notes (Signed)
Patient ID: [name], female   DOB: Jul 05, 1996, 24 y.o.   MRN: [id number] FACULTY PRACTICE ANTEPARTUM(COMPREHENSIVE) NOTE  KEITHA KOLK is a 24 y.o. G1P0 at [redacted]w[redacted]d by best clinical estimate who is admitted for vaginal bleeding in the third trimester.   Fetal presentation is cephalic. Length of Stay:  11  Days  Subjective: Patient had fall in the bathroom onto right hip this morning, no abdominal trauma. Has been monitored, reactive FHR tracing. No bleeding noted. No regular contractions.  Patient reports the fetal movement as active. Patient reports uterine contraction activity as irregular. Patient reports  vaginal bleeding as none. Patient describes fluid per vagina as none.  Vitals:  Blood pressure (!) 117/54, pulse 98, temperature 98.4 F (36.9 C), temperature source Oral, resp. rate 16, height 5\' 8"  (1.727 m), weight 89.8 kg, last menstrual period 05/26/2019, SpO2 100 %. Physical Examination: General appearance - alert, well appearing, and in no distress Chest - normal effort Abdomen - gravid, non-tender Fundal Height:  size equals dates Extremities: Homans sign is negative, no sign of DVT  Membranes:intact  Fetal Monitoring:  Baseline: 150 bpm, Variability: Moderate {6-25 bpm), Accelerations: Reactive and Decelerations: Absent   Medications:  Scheduled . docusate sodium  100 mg Oral Daily  . hydrocortisone   Rectal BID  . polyethylene glycol  17 g Oral Daily  . prenatal multivitamin  1 tablet Oral Q1200   I have reviewed the patient's current medications.   Imaging: 05/28/2019 MFM FETAL BPP WO NON STRESS  Result Date: 02/02/2020 ----------------------------------------------------------------------  OBSTETRICS REPORT                       (Signed Final 02/02/2020 11:15 am) ---------------------------------------------------------------------- Patient Info  ID #:       04/01/2020                          D.O.B.:  06/06/1996 (24 yrs)  Name:       [birth year] Mccadden                    Visit Date: 02/02/2020 08:52 am ---------------------------------------------------------------------- Performed By  Performed By:     04/01/2020 BS,      Ref. Address:     Faculty                    RDMS  Attending:        Emeline Darling MD        [facility]  Referred By:      Noralee Space CNM ---------------------------------------------------------------------- Orders   #  Description                          Code         Ordered By   1  Sharyon Cable MFM FETAL BPP WO NON  69629.52     [facility]      STRESS  ----------------------------------------------------------------------   #  Order #                    Accession #                 Episode #   1  [id number]                  [id number]                  [id number]  ---------------------------------------------------------------------- Indications   Vaginal bleeding in pregnancy, third trimester O46.93   Low Risk NIPS   [redacted] weeks gestation of pregnancy                Z3A.36  ---------------------------------------------------------------------- Vital Signs                                                 Height:        5'6" ---------------------------------------------------------------------- Fetal Evaluation  Num Of Fetuses:         1  Fetal Heart Rate(bpm):  158  Cardiac Activity:       Observed  Presentation:           Cephalic  Placenta:               Posterior  P. Cord Insertion:      Previously Visualized  Amniotic Fluid  AFI FV:      Within normal limits  AFI Sum(cm)     %Tile       Largest Pocket(cm)  13.37           47          4.96  RUQ(cm)                     LUQ(cm)        LLQ(cm)  4.37                        4.96           4.04  Comment:    No placental abruption identified. ---------------------------------------------------------------------- Biophysical Evaluation  Amniotic F.V:   Pocket => 2 cm             F. Tone:         Observed  F. Movement:    Observed                   Score:          6/8  F. Breathing:   Not Observed ---------------------------------------------------------------------- OB History  Gravidity:    1 ---------------------------------------------------------------------- Gestational Age  Best:          36w 0d     Det. ByLoman Chroman         EDD:   03/01/20                                      (07/26/19) ---------------------------------------------------------------------- Impression  Patient was admitted last week with complaints of vaginal  bleeding.  She does not report vaginal bleeding now.  Amniotic fluid is normal and good fetal activity is seen.  Fetal  breathing movements did not meet the criteria of BPP.  BPP 6/8.  I reviewed the NST and it is reactive. ---------------------------------------------------------------------- Recommendations  -Repeat BPP in 3 to 4 days or if NST is nonreactive. ----------------------------------------------------------------------                  Noralee Space, MD Electronically Signed Final Report   02/02/2020 11:15 am ----------------------------------------------------------------------  [place] MFM OB FOLLOW UP  Result Date: 01/26/2020 ----------------------------------------------------------------------  OBSTETRICS REPORT                       (Signed Final 01/26/2020 01:53 pm) ---------------------------------------------------------------------- Patient Info  ID #:       [id number]                          D.O.B.:  06-Oct-1996 (24 yrs)  Name:       Jacinto Halim Damaso                   Visit Date: 01/26/2020 11:42 am ---------------------------------------------------------------------- Performed By  Performed By:     Emeline Darling BS,      Ref. Address:     Faculty                    RDMS  Attending:        Lin Landsman      [facility]  Referred By:      Sharyon Cable CNM ---------------------------------------------------------------------- Orders   #  Description                          Code         Ordered By   1  [place] MFM OB FOLLOW UP                  88916.94     Steward Drone  ----------------------------------------------------------------------   #  Order #                    Accession #                 Episode #   1  [id number]                  [id number]                  [id number]  ---------------------------------------------------------------------- Indications   Vaginal bleeding in pregnancy, third           O46.93   trimester x 2 weeks   [redacted] weeks gestation of pregnancy                Z3A.35   Encounter for other antenatal screening        Z36.2   follow-up  ---------------------------------------------------------------------- Vital Signs  Height:        5'6" ---------------------------------------------------------------------- Fetal Evaluation  Num Of Fetuses:         1  Fetal Heart Rate(bpm):  157  Cardiac Activity:       Observed  Presentation:           Cephalic  Placenta:               Posterior  P. Cord Insertion:      Previously Visualized  Amniotic Fluid  AFI FV:      Within normal limits  AFI Sum(cm)     %Tile       Largest Pocket(cm)  15.29           55          6.01  RUQ(cm)                     LUQ(cm)        LLQ(cm)  4.23                        6.01           5.05  Comment:    No placental abruption identified on Ultrasound today. ---------------------------------------------------------------------- Biometry  BPD:      85.5  mm     G. Age:  34w 3d         36  %    CI:        73.53   %    70 - 86                                                          FL/HC:      20.6   %    20.1 - 22.3  HC:      316.8  mm     G. Age:  35w 4d         30  %    HC/AC:      0.96        0.93 - 1.11  AC:      329.2  mm     G. Age:  36w 6d         94  %    FL/BPD:     76.4   %    71 - 87  FL:       65.3  mm      G. Age:  33w 5d         13  %    FL/AC:      19.8   %    20 - 24  Est. FW:    2731  gm           6 lb     66  % ---------------------------------------------------------------------- OB History  Gravidity:    1 ---------------------------------------------------------------------- Gestational Age  U/S Today:     35w 1d                                        EDD:   02/29/20  Best:          35w 0d     Det.  ByLoman Chroman         EDD:   03/01/20                                      (07/26/19) ---------------------------------------------------------------------- Anatomy  Cranium:               Appears normal         Aortic Arch:            Appears normal  Cavum:                 Previously seen        Ductal Arch:            Appears normal  Ventricles:            Previously seen        Diaphragm:              Appears normal  Choroid Plexus:        Previously seen        Stomach:                Appears normal, left                                                                        sided  Cerebellum:            Previously seen        Abdomen:                Appears normal  Posterior Fossa:       Previously seen        Abdominal Wall:         Appears nml (cord                                                                        insert, abd wall)  Nuchal Fold:           Previously seen        Cord Vessels:           Appears normal (3                                                                        vessel cord)  Face:                  Orbits and profile     Kidneys:                Appear normal  previously seen  Lips:                  Previously seen        Bladder:                Appears normal  Thoracic:              Appears normal         Spine:                  Appears normal  Heart:                 Appears normal         Upper Extremities:      Appears normal                         (4CH, axis, and                         situs)  RVOT:                  Appears normal          Lower Extremities:      Appears normal  LVOT:                  Appears normal  Other:  Heels and 5th digit visualized. Female gender. Nasal bone visualized.          Technically difficult due to fetal position. ---------------------------------------------------------------------- Cervix Uterus Adnexa  Cervix  Not visualized (advanced GA >24wks) ---------------------------------------------------------------------- Impression  Ms. Carlin is here for follow up growth. She also complained  of bright red vaginal bleeding that has persisted on a weekly  basis since Christmas after a signficant fall.  Today she denies uterine tenderness or contractions. Today  we observed normal fetal growth, amniotic fluid and fetal  movement. There was no evidence of placental abruption or  previa.  I discussed the suggested diagnosis of chronic placental  abruption. I discussed the management to include inpatient  management or outpatient modified bed rest. She expressed  financial pressure and needing to work and would like to  avoid hospitalization at this time. However, she said she  would consider and speak with her work. I also recommend  delivery between 36-37 weeks if the bleeding persisted.  I spoke with Ms. Steward Drone, CNM regarding this plan.  She will speak with Ms. Pillard and offer outpatient steroids as  well.  If Ms. Kolodny remains outpatient we have scheduled her to  return in 1 week for a BPP. ---------------------------------------------------------------------- Recommendations  Inpatient/outpatient rest  BMZ  Consider delivery between 36-37 weeks. ----------------------------------------------------------------------               Lin Landsman, MD Electronically Signed Final Report   01/26/2020 01:53 pm ----------------------------------------------------------------------   ASSESSMENT: Active Problems:   Vaginal bleeding during pregnancy   PLAN: Continue inpatient hospitalization until delivery or no  further bleeding Recommendation is delivery at 37 weeks; will have repeat BPP tomorrow and follow up MFM recommendations as patient want to see if she can prolong pregnancy further. Routine antenatal care  Jaynie Collins, MD 02/07/2020,10:37 AM

## 2020-02-07 NOTE — Plan of Care (Signed)
  Problem: Safety: Goal: Ability to remain free from injury will improve Outcome: Progressing   Problem: Education: Goal: Knowledge of the prescribed therapeutic regimen will improve Outcome: Progressing   

## 2020-02-07 NOTE — Progress Notes (Signed)
   02/07/20 0915  What Happened  Was fall witnessed? No  Was patient injured? No  Patient found in bathroom (pt up in bathroom; fall at bedside per pt)  Found by No one-pt stated they fell  Stated prior activity ambulating-unassisted  Follow Up  MD notified Dr. Rosana Hoes  Time MD notified [phone]  Family notified No - patient refusal (pt can contact family)  Additional tests No  Adult Fall Risk Assessment  Risk Factor Category (scoring not indicated) High fall risk per protocol (document High fall risk);Fall has occurred during this admission (document High fall risk)  Age 24  Fall History: Fall within 6 months prior to admission 5  Elimination; Bowel and/or Urine Incontinence 0  Elimination; Bowel and/or Urine Urgency/Frequency 0  Medications: includes PCA/Opiates, Anti-convulsants, Anti-hypertensives, Diuretics, Hypnotics, Laxatives, Sedatives, and Psychotropics 0  Patient Care Equipment 1  Mobility-Assistance 0  Mobility-Gait 0  Mobility-Sensory Deficit 0  Altered awareness of immediate physical environment 0  Impulsiveness 0  Lack of understanding of one's physical/cognitive limitations 0  Total Score 6  Patient Fall Risk Level High fall risk  Adult Fall Risk Interventions  Required Bundle Interventions *See Row Information* High fall risk - low, moderate, and high requirements implemented  Additional Interventions Room near nurses station  Screening for Fall Injury Risk (To be completed on HIGH fall risk patients) - Assessing Need for Low Bed  Risk For Fall Injury- Low Bed Criteria Previous fall this admission  Will Implement Low Bed and Floor Mats No - Criteria no longer met for low bed (pt has current bed in lowest position)

## 2020-02-07 NOTE — Progress Notes (Addendum)
Pt called out for RN and stated that "there is water on the floor and I fell and busted my ass." Pt stated she fell on her right hip. No outer physical injury noted. Pt in stable condition. Pt was up in the bathroom performing oral hygiene. Dr. Earlene Plater made aware and no new orders at this time. Vitals and EFM to be obtained after pt is done in the bathroom per pt request.

## 2020-02-08 ENCOUNTER — Inpatient Hospital Stay (HOSPITAL_BASED_OUTPATIENT_CLINIC_OR_DEPARTMENT_OTHER): Payer: Medicaid Other

## 2020-02-08 DIAGNOSIS — Z3A36 36 weeks gestation of pregnancy: Secondary | ICD-10-CM

## 2020-02-08 DIAGNOSIS — O4693 Antepartum hemorrhage, unspecified, third trimester: Secondary | ICD-10-CM

## 2020-02-08 NOTE — Discharge Summary (Signed)
Antenatal Physician Discharge Summary  Patient ID: Kristi Roth MRN: [id number] DOB/AGE: June 30, 1996 24 y.o.  Admit date: 01/27/2020 Discharge date: 02/08/2020  Admission Diagnoses: vaginal bleeding in 3rd trimester  Discharge Diagnoses:  Principal Problem:   Vaginal bleeding in pregnancy, third trimester Active Problems:   Supervision of high-risk pregnancy   Prenatal Procedures: NST, [place]  Consults: Maternal Fetal Medicine  Hospital Course:  This is a 24 y.o. G1P0 with IUP at 63w6dadmitted for vaginal bleeding in the third trimester. Concern for chronic abruption after fall on 12/24/19. She was admitted on 01/27/20 for bleeding, had bright red bleeding that day that turned brown and stopped after 2 days. She has not had any bleeding since. She was also feeling pelvic pressure but that has also subsided. Patient kept in house and recommended for delivery at 37 weeks which she has declined. Given no ongoing bleeding and lack of other symptoms, had BPP today. Reviewed with MFM. As she has had no further bleeding, reviewed course with patient. I recommended induction at 37 weeks and she declines, understands risks to herself and baby. Given that MFM feels she can be managed as outpatient, gave option of discharge home with close follow up and weekly BPP, to which she is agreeable. Will return with any bleeding or worsening of symptoms. Office will call her tomorrow with follow up and she verbalizes understanding of importance of routine care. Reactive NST this afternoon. Maternal/fetal status stable. She is discharged home with instructions and will follow up with [place] in office.     Discharge Exam: Temp:  [98.2 F (36.8 C)-98.3 F (36.8 C)] 98.3 F (36.8 C) (02/09 1135) Pulse Rate:  [95-97] 97 (02/09 1135) Resp:  [17-18] 18 (02/09 1135) BP: (121-131)/(52-68) 121/68 (02/09 1135) SpO2:  [100 %] 100 % (02/09 1135) Physical Examination: CONSTITUTIONAL: Well-developed, well-nourished female  in no acute distress.  HENT:  Normocephalic, atraumatic, External right and left ear normal. Oropharynx is clear and moist EYES: Conjunctivae and EOM are normal. Pupils are equal, round, and reactive to light. No scleral icterus.  NECK: Normal range of motion, supple, no masses SKIN: Skin is warm and dry. No rash noted. Not diaphoretic. No erythema. No pallor. [place] Alert and oriented to person, place, and time. Normal reflexes, muscle tone coordination. No cranial nerve deficit noted. PSYCHIATRIC: Normal mood and affect. Normal behavior. Normal judgment and thought content. CARDIOVASCULAR: Normal heart rate noted RESPIRATORY: Effort normal, no problems with respiration noted MUSCULOSKELETAL: Normal range of motion. No edema and no tenderness. 2+ distal pulses. ABDOMEN: Soft, nontender, nondistended, gravid. CERVIX:   deferred  Fetal monitoring: FHR: 150 bpm, Variability: moderate, Accelerations: Present, Decelerations: Absent  Uterine activity: no contractions per hour  Significant Diagnostic Studies:  Results for orders placed or performed during the hospital encounter of 01/27/20 (from the past 168 hour(s))  Type and screen [facility]  Collection Time: 02/04/20  7:32 AM  Result Value Ref Range   ABO/RH(D) AB POS    Antibody Screen NEG    Sample Expiration      02/07/2020,2359 [facility]formed [facility] [place] [id number]  CBC   Collection Time: 02/05/20 12:41 PM  Result Value Ref Range   WBC 8.4 4.0 - 10.5 K/uL   RBC 4.04 3.87 - 5.11 MIL/uL   Hemoglobin 12.5 12.0 - 15.0 g/dL   HCT 36.5 36.0 - 46.0 %   MCV 90.3 80.0 - 100.0 fL   MCH  30.9 26.0 - 34.0 pg   MCHC 34.2 30.0 - 36.0 g/dL   RDW 12.3 11.5 - 15.5 %   Platelets 224 150 - 400 K/uL   nRBC 0.0 0.0 - 0.2 %  Type and screen [employer]   Collection Time: 02/07/20  8:10 AM  Result Value Ref Range   ABO/RH(D) AB POS    Antibody Screen NEG    Sample  Expiration      02/10/2020,2359 Performed at [facility], [place] [address] [place], [place] [address]     Discharge Condition: Stable  Disposition: Discharge disposition: 01-Home or Self Care        Discharge Instructions    LABOR:  When conractions begin, you should start to time them from the beginning of one contraction to the beginning  of the next.  When contractions are 5 - 10 minutes apart or less and have been regular for at least an hour, you should call your health care provider.   Complete by: As directed    Notify physician for bleeding from the vagina   Complete by: As directed    Notify physician for blurring of vision or spots before the eyes   Complete by: As directed    Notify physician for chills or fever   Complete by: As directed    Notify physician for fainting spells, "black outs" or loss of consciousness   Complete by: As directed    Notify physician for increase in vaginal discharge   Complete by: As directed    Notify physician for leaking of fluid   Complete by: As directed    Notify physician for pain or burning when urinating   Complete by: As directed    Notify physician for pelvic pressure (sudden increase)   Complete by: As directed    Notify physician for severe or continued nausea or vomiting   Complete by: As directed    Notify physician for sudden gushing of fluid from the vagina (with or without continued leaking)   Complete by: As directed    Notify physician for sudden, constant, or occasional abdominal pain   Complete by: As directed    Notify physician if baby moving less than usual   Complete by: As directed      Allergies as of 02/08/2020      Reactions   Latex Dermatitis      Medication List    STOP taking these medications   metroNIDAZOLE 500 MG tablet Commonly known as: FLAGYL     TAKE these medications   acetaminophen 500 MG tablet Commonly known as: TYLENOL Take 2 tablets (1,000 mg total) by mouth every 6  (six) hours as needed for moderate pain.   Blood Pressure Monitor Kit 1 Device by Does not apply route once a week. To be monitored Regularly at home. Dx Code Z34.0   cyclobenzaprine 10 MG tablet Commonly known as: FLEXERIL Take 1 tablet (10 mg total) by mouth 3 (three) times daily as needed for muscle spasms.   Misc. Devices Misc Dispense one maternity belt for patient   prenatal multivitamin Tabs tablet Take 1 tablet by mouth daily at 12 noon.      Follow-up Information    [facility]. Go to.   Why: follow up appt Contact information: [place] [id number] [phone]          Signed: Feliz Beam, M.D. Attending [facility] [employer] [facility] (Faculty  Practice)  02/08/2020, 5:23 PM

## 2020-02-08 NOTE — Progress Notes (Signed)
Patient ID: Kristi Roth, female   DOB: 11-08-1996, 24 y.o.   MRN: [id number] FACULTY PRACTICE ANTEPARTUM(COMPREHENSIVE) NOTE  Kristi Roth is a 24 y.o. G1P0 at [redacted]w[redacted]d by best clinical estimate who is admitted for vaginal bleeding in the third trimester.   Fetal presentation is cephalic. Length of Stay:  12  Days  Subjective: No complaints except for R hip pain today (had fall yesterday). Patient reports the fetal movement as active. Patient reports uterine contraction activity as irregular. Patient reports  vaginal bleeding as none. Patient describes fluid per vagina as none.  Vitals:  Blood pressure (!) 131/52, pulse 95, temperature 98.2 F (36.8 C), temperature source Oral, resp. rate 17, height 5\' 8"  (1.727 m), weight 89.8 kg, last menstrual period 05/26/2019, SpO2 100 %. Physical Examination: General appearance - alert, well appearing, and in no distress Chest - normal effort Abdomen - gravid, non-tender Fundal Height:  size equals dates Extremities: Homans sign is negative, no sign of DVT  Membranes:intact  Fetal Monitoring:  Baseline: 155 bpm, Variability: Moderate {6-25 bpm), Accelerations: Reactive and Decelerations: Absent   Medications:  Scheduled . docusate sodium  100 mg Oral Daily  . hydrocortisone   Rectal BID  . polyethylene glycol  17 g Oral Daily  . prenatal multivitamin  1 tablet Oral Q1200   I have reviewed the patient's current medications.   Imaging: 05/28/2019 MFM FETAL BPP WO NON STRESS  Result Date: 02/02/2020 ----------------------------------------------------------------------  OBSTETRICS REPORT                       (Signed Final 02/02/2020 11:15 am) ---------------------------------------------------------------------- Patient Info  ID #:       04/01/2020                          D.O.B.:  1996/09/15 (23 yrs)  Name:       [birth year] Kristi Roth                   Visit Date: 02/02/2020 08:52 am ----------------------------------------------------------------------  Performed By  Performed By:     04/01/2020 BS,      Ref. Address:     Faculty                    RDMS  Attending:        Emeline Darling MD        [facility]  Referred By:      Noralee Space CNM ---------------------------------------------------------------------- Orders   #  Description                          Code         Ordered By   1  Sharyon Cable MFM FETAL BPP WO NON              76819.01     Lind Ausley  STRESS  ----------------------------------------------------------------------   #  Order #                    Accession #                 Episode #   1  [id number]                  [id number]                  [id number]  ---------------------------------------------------------------------- Indications   Vaginal bleeding in pregnancy, third trimester O46.93   Low Risk NIPS   [redacted] weeks gestation of pregnancy                Z3A.36  ---------------------------------------------------------------------- Vital Signs                                                 Height:        5'6" ---------------------------------------------------------------------- Fetal Evaluation  Num Of Fetuses:         1  Fetal Heart Rate(bpm):  158  Cardiac Activity:       Observed  Presentation:           Cephalic  Placenta:               Posterior  P. Cord Insertion:      Previously Visualized  Amniotic Fluid  AFI FV:      Within normal limits  AFI Sum(cm)     %Tile       Largest Pocket(cm)  13.37           47          4.96  RUQ(cm)                     LUQ(cm)        LLQ(cm)  4.37                        4.96           4.04  Comment:    No placental abruption identified. ---------------------------------------------------------------------- Biophysical Evaluation  Amniotic F.V:   Pocket => 2 cm             F. Tone:        Observed  F. Movement:    Observed                   Score:          6/8  F. Breathing:   Not Observed  ---------------------------------------------------------------------- OB History  Gravidity:    1 ---------------------------------------------------------------------- Gestational Age  Best:          36w 0d     Det. ByMarcella Dubs         EDD:   03/01/20                                      (07/26/19) ---------------------------------------------------------------------- Impression  Patient was admitted last week with complaints of vaginal  bleeding.  She does not report vaginal bleeding now.  Amniotic fluid is normal and good fetal activity is seen. Fetal  breathing movements did not meet the criteria of BPP.  BPP 6/8.  I reviewed the NST and it is reactive. ---------------------------------------------------------------------- Recommendations  -Repeat BPP in 3 to 4 days or if NST is nonreactive. ----------------------------------------------------------------------                  Tama High, MD Electronically Signed Final Report   02/02/2020 11:15 am ----------------------------------------------------------------------  [place] MFM OB FOLLOW UP  Result Date: 01/26/2020 ----------------------------------------------------------------------  OBSTETRICS REPORT                       (Signed Final 01/26/2020 01:53 pm) ---------------------------------------------------------------------- Patient Info  ID #:       [id number]                          D.O.B.:  Jul 16, 1996 (23 yrs)  Name:       Kristi Roth                   Visit Date: 01/26/2020 11:42 am ---------------------------------------------------------------------- Performed By  Performed By:     Jeanene Erb BS,      Ref. Address:     Faculty                    RDMS  Attending:        Sander Nephew      [facility]  Referred By:      Lajean Manes CNM ---------------------------------------------------------------------- Orders    #  Description                          Code         Ordered By   1  [place] MFM OB FOLLOW UP                  34193.79     Darrol Poke  ----------------------------------------------------------------------   #  Order #                    Accession #                 Episode #   1  [id number]                  [id number]                  [id number]  ---------------------------------------------------------------------- Indications   Vaginal bleeding in pregnancy, third           O46.93   trimester x 2 weeks   [redacted] weeks gestation of pregnancy                Z3A.35   Encounter for other antenatal screening        Z36.2   follow-up  ---------------------------------------------------------------------- Vital Signs  Height:        5'6" ---------------------------------------------------------------------- Fetal Evaluation  Num Of Fetuses:         1  Fetal Heart Rate(bpm):  157  Cardiac Activity:       Observed  Presentation:           Cephalic  Placenta:               Posterior  P. Cord Insertion:      Previously Visualized  Amniotic Fluid  AFI FV:      Within normal limits  AFI Sum(cm)     %Tile       Largest Pocket(cm)  15.29           55          6.01  RUQ(cm)                     LUQ(cm)        LLQ(cm)  4.23                        6.01           5.05  Comment:    No placental abruption identified on Ultrasound today. ---------------------------------------------------------------------- Biometry  BPD:      85.5  mm     G. Age:  34w 3d         36  %    CI:        73.53   %    70 - 86                                                          FL/HC:      20.6   %    20.1 - 22.3  HC:      316.8  mm     G. Age:  35w 4d         30  %    HC/AC:      0.96        0.93 - 1.11  AC:      329.2  mm     G. Age:  36w 6d         94  %    FL/BPD:     76.4   %    71 - 87  FL:       65.3  mm     G. Age:  33w 5d         13  %    FL/AC:      19.8   %    20 - 24  Est. FW:    2731  gm           6  lb     66  % ---------------------------------------------------------------------- OB History  Gravidity:    1 ---------------------------------------------------------------------- Gestational Age  U/S Today:     35w 1d                                        EDD:   02/29/20  Best:          35w 0d     Det.  ByLoman Chroman         EDD:   03/01/20                                      (07/26/19) ---------------------------------------------------------------------- Anatomy  Cranium:               Appears normal         Aortic Arch:            Appears normal  Cavum:                 Previously seen        Ductal Arch:            Appears normal  Ventricles:            Previously seen        Diaphragm:              Appears normal  Choroid Plexus:        Previously seen        Stomach:                Appears normal, left                                                                        sided  Cerebellum:            Previously seen        Abdomen:                Appears normal  Posterior Fossa:       Previously seen        Abdominal Wall:         Appears nml (cord                                                                        insert, abd wall)  Nuchal Fold:           Previously seen        Cord Vessels:           Appears normal (3                                                                        vessel cord)  Face:                  Orbits and profile     Kidneys:                Appear normal  previously seen  Lips:                  Previously seen        Bladder:                Appears normal  Thoracic:              Appears normal         Spine:                  Appears normal  Heart:                 Appears normal         Upper Extremities:      Appears normal                         (4CH, axis, and                         situs)  RVOT:                  Appears normal         Lower Extremities:      Appears normal  LVOT:                  Appears normal  Other:  Heels and 5th  digit visualized. Female gender. Nasal bone visualized.          Technically difficult due to fetal position. ---------------------------------------------------------------------- Cervix Uterus Adnexa  Cervix  Not visualized (advanced GA >24wks) ---------------------------------------------------------------------- Impression  Ms. Natal is here for follow up growth. She also complained  of bright red vaginal bleeding that has persisted on a weekly  basis since Christmas after a signficant fall.  Today she denies uterine tenderness or contractions. Today  we observed normal fetal growth, amniotic fluid and fetal  movement. There was no evidence of placental abruption or  previa.  I discussed the suggested diagnosis of chronic placental  abruption. I discussed the management to include inpatient  management or outpatient modified bed rest. She expressed  financial pressure and needing to work and would like to  avoid hospitalization at this time. However, she said she  would consider and speak with her work. I also recommend  delivery between 36-37 weeks if the bleeding persisted.  I spoke with Ms. Steward Drone, CNM regarding this plan.  She will speak with Ms. Sohail and offer outpatient steroids as  well.  If Ms. Strothman remains outpatient we have scheduled her to  return in 1 week for a BPP. ---------------------------------------------------------------------- Recommendations  Inpatient/outpatient rest  BMZ  Consider delivery between 36-37 weeks. ----------------------------------------------------------------------               Lin Landsman, MD Electronically Signed Final Report   01/26/2020 01:53 pm ----------------------------------------------------------------------   ASSESSMENT: Active Problems:   Vaginal bleeding during pregnancy   PLAN: Continue inpatient hospitalization until delivery or no further bleeding Recommendation is delivery at 37 weeks; will have repeat BPP today and follow up MFM  recommendations as patient want to see if she can prolong pregnancy further. Routine antenatal care  Jaynie Collins, MD 02/08/2020,7:41 AM

## 2020-02-09 ENCOUNTER — Encounter: Payer: Self-pay | Admitting: *Deleted

## 2020-02-09 ENCOUNTER — Other Ambulatory Visit: Payer: Self-pay | Admitting: *Deleted

## 2020-02-09 DIAGNOSIS — O4693 Antepartum hemorrhage, unspecified, third trimester: Secondary | ICD-10-CM

## 2020-02-09 NOTE — Progress Notes (Signed)
BPP ordered per dr Earlene Plater for vaginal bleeding

## 2020-02-10 ENCOUNTER — Encounter: Payer: Self-pay | Admitting: Certified Nurse Midwife

## 2020-02-16 ENCOUNTER — Ambulatory Visit (INDEPENDENT_AMBULATORY_CARE_PROVIDER_SITE_OTHER): Payer: Medicaid Other | Admitting: Obstetrics & Gynecology

## 2020-02-16 ENCOUNTER — Ambulatory Visit (HOSPITAL_COMMUNITY)
Admission: RE | Admit: 2020-02-16 | Discharge: 2020-02-16 | Disposition: A | Discharge status: Home / Self Care | Payer: Medicaid Other | Source: Ambulatory Visit | Attending: Obstetrics and Gynecology | Admitting: Obstetrics and Gynecology

## 2020-02-16 ENCOUNTER — Other Ambulatory Visit: Payer: Self-pay

## 2020-02-16 ENCOUNTER — Other Ambulatory Visit: Payer: Self-pay | Admitting: Advanced Practice Midwife

## 2020-02-16 VITALS — BP 116/73 | HR 90 | Wt 195.0 lb

## 2020-02-16 DIAGNOSIS — O0993 Supervision of high risk pregnancy, unspecified, third trimester: Secondary | ICD-10-CM

## 2020-02-16 DIAGNOSIS — R8271 Bacteriuria: Secondary | ICD-10-CM

## 2020-02-16 DIAGNOSIS — O4693 Antepartum hemorrhage, unspecified, third trimester: Secondary | ICD-10-CM

## 2020-02-16 DIAGNOSIS — Z3A38 38 weeks gestation of pregnancy: Secondary | ICD-10-CM | POA: Diagnosis not present

## 2020-02-16 NOTE — Progress Notes (Signed)
   PRENATAL VISIT NOTE  Subjective:  Kristi Roth is a 24 y.o. G1P0 at [redacted]w[redacted]d being seen today for ongoing prenatal care.  She is currently monitored for the following issues for this high-risk pregnancy and has Supervision of high-risk pregnancy; GBS bacteriuria; and Vaginal bleeding in pregnancy, third trimester on their problem list.  Patient reports no complaints.  Contractions: Not present. Vag. Bleeding: None.  Movement: Present. Denies leaking of fluid.   The following portions of the patient's history were reviewed and updated as appropriate: allergies, current medications, past family history, past medical history, past social history, past surgical history and problem list.   Objective:   Vitals:   02/16/20 1151  BP: 116/73  Pulse: 90  Weight: 195 lb (88.5 kg)    Fetal Status: Fetal Heart Rate (bpm): 155   Movement: Present     General:  Alert, oriented and cooperative. Patient is in no acute distress.  Skin: Skin is warm and dry. No rash noted.   Cardiovascular: Normal heart rate noted  Respiratory: Normal respiratory effort, no problems with respiration noted  Abdomen: Soft, gravid, appropriate for gestational age.  Pain/Pressure: Absent     Pelvic: Cervical exam deferred        Extremities: Normal range of motion.     Mental Status: Normal mood and affect. Normal behavior. Normal judgment and thought content.   Assessment and Plan:  Pregnancy: G1P0 at [redacted]w[redacted]d 1. Supervision of high risk pregnancy in third trimester S/p episode of vaginal bleeding  2. GBS bacteriuria Treat in labor  3. Vaginal bleeding in pregnancy, third trimester Resolved and US shows no abruptio  Term labor symptoms and general obstetric precautions including but not limited to vaginal bleeding, contractions, leaking of fluid and fetal movement were reviewed in detail with the patient. Please refer to After Visit Summary for other counseling recommendations.   Return if symptoms worsen or fail  to improve.  Future Appointments  Date Time Provider Department Center  02/16/2020  2:45 PM Adam Phenix, MD [facility] None   IOL 39 weeks Scheryl Darter, MD

## 2020-02-16 NOTE — Patient Instructions (Signed)

## 2020-02-16 NOTE — Progress Notes (Signed)
Pt states she is having upper abd pain as well as lower pelvic pain.

## 2020-02-17 ENCOUNTER — Telehealth (HOSPITAL_COMMUNITY): Payer: Self-pay | Admitting: *Deleted

## 2020-02-17 ENCOUNTER — Encounter (HOSPITAL_COMMUNITY): Payer: Self-pay | Admitting: *Deleted

## 2020-02-17 NOTE — Telephone Encounter (Signed)
Preadmission screen  

## 2020-02-20 ENCOUNTER — Encounter: Payer: Self-pay | Admitting: Certified Nurse Midwife

## 2020-02-21 ENCOUNTER — Other Ambulatory Visit (HOSPITAL_COMMUNITY): Payer: Medicaid Other

## 2020-02-21 ENCOUNTER — Other Ambulatory Visit (HOSPITAL_COMMUNITY)
Admission: RE | Admit: 2020-02-21 | Discharge: 2020-02-21 | Disposition: A | Discharge status: Home / Self Care | Payer: Medicaid Other | Source: Ambulatory Visit | Attending: Family Medicine | Admitting: Family Medicine

## 2020-02-21 DIAGNOSIS — Z3493 Encounter for supervision of normal pregnancy, unspecified, third trimester: Secondary | ICD-10-CM

## 2020-02-21 LAB — SARS CORONAVIRUS 2 (TAT 6-24 HRS): SARS Coronavirus 2: NEGATIVE

## 2020-02-22 ENCOUNTER — Other Ambulatory Visit: Payer: Self-pay

## 2020-02-22 ENCOUNTER — Inpatient Hospital Stay (HOSPITAL_COMMUNITY)
Admission: AD | Admit: 2020-02-22 | Discharge: 2020-02-25 | DRG: 768 | Disposition: A | Discharge status: Home / Self Care | Payer: Medicaid Other | Attending: Obstetrics & Gynecology | Admitting: Obstetrics & Gynecology

## 2020-02-22 ENCOUNTER — Encounter (HOSPITAL_COMMUNITY): Payer: Self-pay | Admitting: Obstetrics and Gynecology

## 2020-02-22 DIAGNOSIS — O4292 Full-term premature rupture of membranes, unspecified as to length of time between rupture and onset of labor: Secondary | ICD-10-CM | POA: Diagnosis present

## 2020-02-22 DIAGNOSIS — Z8759 Personal history of other complications of pregnancy, childbirth and the puerperium: Secondary | ICD-10-CM | POA: Diagnosis present

## 2020-02-22 DIAGNOSIS — Z3A38 38 weeks gestation of pregnancy: Secondary | ICD-10-CM | POA: Diagnosis not present

## 2020-02-22 DIAGNOSIS — O4593 Premature separation of placenta, unspecified, third trimester: Principal | ICD-10-CM | POA: Diagnosis present

## 2020-02-22 DIAGNOSIS — Z3689 Encounter for other specified antenatal screening: Secondary | ICD-10-CM | POA: Diagnosis not present

## 2020-02-22 DIAGNOSIS — Z3A39 39 weeks gestation of pregnancy: Secondary | ICD-10-CM | POA: Diagnosis not present

## 2020-02-22 DIAGNOSIS — Z20822 Contact with and (suspected) exposure to covid-19: Secondary | ICD-10-CM | POA: Diagnosis present

## 2020-02-22 DIAGNOSIS — R8271 Bacteriuria: Secondary | ICD-10-CM | POA: Diagnosis present

## 2020-02-22 DIAGNOSIS — O4693 Antepartum hemorrhage, unspecified, third trimester: Secondary | ICD-10-CM | POA: Diagnosis present

## 2020-02-22 DIAGNOSIS — O99824 Streptococcus B carrier state complicating childbirth: Secondary | ICD-10-CM | POA: Diagnosis present

## 2020-02-22 DIAGNOSIS — O0990 Supervision of high risk pregnancy, unspecified, unspecified trimester: Secondary | ICD-10-CM

## 2020-02-22 LAB — CBC
HCT: 40.9 % (ref 36.0–46.0)
Hemoglobin: 14 g/dL (ref 12.0–15.0)
MCH: 30.9 pg (ref 26.0–34.0)
MCHC: 34.2 g/dL (ref 30.0–36.0)
MCV: 90.3 fL (ref 80.0–100.0)
Platelets: 218 10*3/uL (ref 150–400)
RBC: 4.53 MIL/uL (ref 3.87–5.11)
RDW: 12.5 % (ref 11.5–15.5)
WBC: 8.4 10*3/uL (ref 4.0–10.5)
nRBC: 0 % (ref 0.0–0.2)

## 2020-02-22 LAB — TYPE AND SCREEN
ABO/RH(D): AB POS
Antibody Screen: NEGATIVE

## 2020-02-22 LAB — POCT FERN TEST: POCT Fern Test: POSITIVE

## 2020-02-22 MED ORDER — OXYTOCIN BOLUS FROM INFUSION
500.0000 mL | Freq: Once | INTRAVENOUS | Status: AC
  Administered 2020-02-23: 500 mL via INTRAVENOUS

## 2020-02-22 MED ORDER — ACETAMINOPHEN 325 MG PO TABS: 650.0000 mg | ORAL_TABLET | ORAL | Status: DC | PRN

## 2020-02-22 MED ORDER — PHENYLEPHRINE 40 MCG/ML (10ML) SYRINGE FOR IV PUSH (FOR BLOOD PRESSURE SUPPORT): 80.0000 ug | PREFILLED_SYRINGE | INTRAVENOUS | Status: DC | PRN

## 2020-02-22 MED ORDER — OXYTOCIN 40 UNITS IN NORMAL SALINE INFUSION - SIMPLE MED
2.5000 [IU]/h | INTRAVENOUS | Status: DC
  Administered 2020-02-23: 2.5 [IU]/h via INTRAVENOUS

## 2020-02-22 MED ORDER — LACTATED RINGERS IV SOLN: INTRAVENOUS | Status: DC

## 2020-02-22 MED ORDER — FENTANYL CITRATE (PF) 100 MCG/2ML IJ SOLN
100.0000 ug | INTRAMUSCULAR | Status: DC | PRN
  Administered 2020-02-23 (×2): 100 ug via INTRAVENOUS
  Filled 2020-02-22 (×2): qty 2

## 2020-02-22 MED ORDER — OXYCODONE-ACETAMINOPHEN 5-325 MG PO TABS: 2.0000 | ORAL_TABLET | ORAL | Status: DC | PRN

## 2020-02-22 MED ORDER — TERBUTALINE SULFATE 1 MG/ML IJ SOLN: 0.2500 mg | Freq: Once | INTRAMUSCULAR | Status: DC | PRN

## 2020-02-22 MED ORDER — DIPHENHYDRAMINE HCL 50 MG/ML IJ SOLN: 12.5000 mg | INTRAMUSCULAR | Status: DC | PRN

## 2020-02-22 MED ORDER — SOD CITRATE-CITRIC ACID 500-334 MG/5ML PO SOLN: 30.0000 mL | ORAL | Status: DC | PRN

## 2020-02-22 MED ORDER — EPHEDRINE 5 MG/ML INJ: 10.0000 mg | INTRAVENOUS | Status: DC | PRN

## 2020-02-22 MED ORDER — OXYTOCIN 40 UNITS IN NORMAL SALINE INFUSION - SIMPLE MED
1.0000 m[IU]/min | INTRAVENOUS | Status: DC
  Administered 2020-02-23: 2 m[IU]/min via INTRAVENOUS
  Filled 2020-02-22: qty 1000

## 2020-02-22 MED ORDER — LACTATED RINGERS IV SOLN: 500.0000 mL | INTRAVENOUS | Status: DC | PRN

## 2020-02-22 MED ORDER — FENTANYL-BUPIVACAINE-NACL 0.5-0.125-0.9 MG/250ML-% EP SOLN
12.0000 mL/h | EPIDURAL | Status: DC | PRN
  Filled 2020-02-22: qty 250

## 2020-02-22 MED ORDER — ONDANSETRON HCL 4 MG/2ML IJ SOLN: 4.0000 mg | Freq: Four times a day (QID) | INTRAMUSCULAR | Status: DC | PRN

## 2020-02-22 MED ORDER — OXYCODONE-ACETAMINOPHEN 5-325 MG PO TABS: 1.0000 | ORAL_TABLET | ORAL | Status: DC | PRN

## 2020-02-22 MED ORDER — SODIUM CHLORIDE 0.9 % IV SOLN
5.0000 10*6.[IU] | Freq: Once | INTRAVENOUS | Status: AC
  Administered 2020-02-22: 5 10*6.[IU] via INTRAVENOUS
  Filled 2020-02-22: qty 5

## 2020-02-22 MED ORDER — PENICILLIN G POT IN DEXTROSE 60000 UNIT/ML IV SOLN
3.0000 10*6.[IU] | INTRAVENOUS | Status: DC
  Administered 2020-02-23: 3 10*6.[IU] via INTRAVENOUS
  Filled 2020-02-22: qty 50

## 2020-02-22 MED ORDER — LIDOCAINE HCL (PF) 1 % IJ SOLN
30.0000 mL | INTRAMUSCULAR | Status: AC | PRN
  Administered 2020-02-23: 03:00:00 4 mL via SUBCUTANEOUS
  Administered 2020-02-23: 02:00:00 8 mL via SUBCUTANEOUS

## 2020-02-22 MED ORDER — LACTATED RINGERS IV SOLN
500.0000 mL | Freq: Once | INTRAVENOUS | Status: AC
  Administered 2020-02-23: 500 mL via INTRAVENOUS
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
## 2020-02-22 NOTE — MAU Note (Signed)
Pt presents to MAU c/o light pink spotting that started around 1500 after she got up from a nap.  No pain at this time.  Reports feeling good fetal movement.  States she has a "placenta previa" and is supposed to be induced.

## 2020-02-22 NOTE — MAU Note (Signed)
Started bleeding, states known previa, promised them when she left that if she started bleeding she would return.  Last night and the night before, she had really bad sharp pain in lower abd,  Not as bad now, but lower abd is tender.   Is to be induced on Friday.
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
## 2020-02-23 ENCOUNTER — Encounter: Payer: Self-pay | Admitting: Certified Nurse Midwife

## 2020-02-23 ENCOUNTER — Encounter (HOSPITAL_COMMUNITY): Payer: Self-pay | Admitting: Obstetrics & Gynecology

## 2020-02-23 ENCOUNTER — Inpatient Hospital Stay (HOSPITAL_COMMUNITY): Payer: Medicaid Other | Admitting: Anesthesiology

## 2020-02-23 ENCOUNTER — Inpatient Hospital Stay (HOSPITAL_COMMUNITY): Payer: Medicaid Other

## 2020-02-23 DIAGNOSIS — Z3A39 39 weeks gestation of pregnancy: Secondary | ICD-10-CM

## 2020-02-23 DIAGNOSIS — O99824 Streptococcus B carrier state complicating childbirth: Secondary | ICD-10-CM

## 2020-02-23 LAB — RPR: RPR Ser Ql: NONREACTIVE

## 2020-02-23 MED ORDER — DIPHENHYDRAMINE HCL 25 MG PO CAPS: 25.0000 mg | ORAL_CAPSULE | Freq: Four times a day (QID) | ORAL | Status: DC | PRN

## 2020-02-23 MED ORDER — SIMETHICONE 80 MG PO CHEW: 80.0000 mg | CHEWABLE_TABLET | ORAL | Status: DC | PRN

## 2020-02-23 MED ORDER — WITCH HAZEL-GLYCERIN EX PADS
1.0000 "application " | MEDICATED_PAD | CUTANEOUS | Status: DC | PRN
  Administered 2020-02-23: 1 via TOPICAL

## 2020-02-23 MED ORDER — BISACODYL 5 MG PO TBEC
5.0000 mg | DELAYED_RELEASE_TABLET | Freq: Every day | ORAL | Status: DC | PRN
  Filled 2020-02-23: qty 1

## 2020-02-23 MED ORDER — COCONUT OIL OIL: 1.0000 "application " | TOPICAL_OIL | Status: DC | PRN

## 2020-02-23 MED ORDER — TETANUS-DIPHTH-ACELL PERTUSSIS 5-2.5-18.5 LF-MCG/0.5 IM SUSP: 0.5000 mL | Freq: Once | INTRAMUSCULAR | Status: DC

## 2020-02-23 MED ORDER — ZOLPIDEM TARTRATE 5 MG PO TABS: 5.0000 mg | ORAL_TABLET | Freq: Every evening | ORAL | Status: DC | PRN

## 2020-02-23 MED ORDER — BENZOCAINE-MENTHOL 20-0.5 % EX AERO
1.0000 "application " | INHALATION_SPRAY | CUTANEOUS | Status: DC | PRN
  Administered 2020-02-23: 1 via TOPICAL
  Filled 2020-02-23 (×2): qty 56

## 2020-02-23 MED ORDER — METHYLERGONOVINE MALEATE 0.2 MG/ML IJ SOLN
INTRAMUSCULAR | Status: AC
  Filled 2020-02-23: qty 1

## 2020-02-23 MED ORDER — ONDANSETRON HCL 4 MG/2ML IJ SOLN
4.0000 mg | INTRAMUSCULAR | Status: DC | PRN
  Administered 2020-02-23: 4 mg via INTRAVENOUS
  Filled 2020-02-23: qty 2

## 2020-02-23 MED ORDER — PRENATAL MULTIVITAMIN CH
1.0000 | ORAL_TABLET | Freq: Every day | ORAL | Status: DC
  Administered 2020-02-24 – 2020-02-25 (×2): 1 via ORAL
  Filled 2020-02-23 (×2): qty 1

## 2020-02-23 MED ORDER — DIBUCAINE (PERIANAL) 1 % EX OINT
1.0000 "application " | TOPICAL_OINTMENT | CUTANEOUS | Status: DC | PRN
  Administered 2020-02-24: 1 via RECTAL
  Filled 2020-02-23: qty 28

## 2020-02-23 MED ORDER — POLYETHYLENE GLYCOL 3350 17 G PO PACK
17.0000 g | PACK | Freq: Every day | ORAL | Status: DC
  Administered 2020-02-24: 17 g via ORAL
  Filled 2020-02-23 (×3): qty 1

## 2020-02-23 MED ORDER — SODIUM CHLORIDE (PF) 0.9 % IJ SOLN
INTRAMUSCULAR | Status: DC | PRN
  Administered 2020-02-23: 12 mL/h via EPIDURAL

## 2020-02-23 MED ORDER — OXYCODONE HCL 5 MG PO TABS: 5.0000 mg | ORAL_TABLET | ORAL | Status: DC | PRN

## 2020-02-23 MED ORDER — LIDOCAINE HCL (PF) 1 % IJ SOLN
INTRAMUSCULAR | Status: AC
  Administered 2020-02-23: 30 mL
  Filled 2020-02-23: qty 30

## 2020-02-23 MED ORDER — METHYLERGONOVINE MALEATE 0.2 MG/ML IJ SOLN
0.2000 mg | Freq: Once | INTRAMUSCULAR | Status: AC
  Administered 2020-02-23: 0.2 mg via INTRAMUSCULAR

## 2020-02-23 MED ORDER — ACETAMINOPHEN 325 MG PO TABS
650.0000 mg | ORAL_TABLET | ORAL | Status: DC | PRN
  Administered 2020-02-24 – 2020-02-25 (×3): 650 mg via ORAL
  Filled 2020-02-23 (×3): qty 2

## 2020-02-23 MED ORDER — IBUPROFEN 600 MG PO TABS
600.0000 mg | ORAL_TABLET | Freq: Four times a day (QID) | ORAL | Status: DC
  Administered 2020-02-23 – 2020-02-25 (×9): 600 mg via ORAL
  Filled 2020-02-23 (×9): qty 1

## 2020-02-23 MED ORDER — ONDANSETRON HCL 4 MG PO TABS: 4.0000 mg | ORAL_TABLET | ORAL | Status: DC | PRN

## 2020-02-23 MED ORDER — SENNOSIDES-DOCUSATE SODIUM 8.6-50 MG PO TABS
2.0000 | ORAL_TABLET | ORAL | Status: DC
  Administered 2020-02-24 (×2): 2 via ORAL
  Filled 2020-02-23 (×2): qty 2

## 2020-02-23 NOTE — Anesthesia Preprocedure Evaluation (Signed)
Anesthesia Evaluation  Patient identified by MRN, date of birth, ID band Patient awake    Reviewed: Allergy & Precautions, H&P , NPO status , Patient's Chart, lab work & pertinent test results  Airway Mallampati: I  TM Distance: >3 FB Neck ROM: full    Dental no notable dental hx. (+) Teeth Intact   Pulmonary neg pulmonary ROS,    Pulmonary exam normal breath sounds clear to auscultation       Cardiovascular negative cardio ROS Normal cardiovascular exam Rhythm:regular Rate:Normal     Neuro/Psych negative neurological ROS  negative psych ROS   GI/Hepatic negative GI ROS, Neg liver ROS,   Endo/Other  negative endocrine ROS  Renal/GU negative Renal ROS  negative genitourinary   Musculoskeletal negative musculoskeletal ROS (+)   Abdominal Normal abdominal exam  (+)   Peds  Hematology negative hematology ROS (+)   Anesthesia Other Findings   Reproductive/Obstetrics (+) Pregnancy                             Anesthesia Physical Anesthesia Plan  ASA: II  Anesthesia Plan: Epidural   Post-op Pain Management:    Induction:   PONV Risk Score and Plan:   Airway Management Planned:   Additional Equipment:   Intra-op Plan:   Post-operative Plan:   Informed Consent: I have reviewed the patients History and Physical, chart, labs and discussed the procedure including the risks, benefits and alternatives for the proposed anesthesia with the patient or authorized representative who has indicated his/her understanding and acceptance.       Plan Discussed with:   Anesthesia Plan Comments:         Anesthesia Quick Evaluation  

## 2020-02-23 NOTE — Anesthesia Postprocedure Evaluation (Signed)
Anesthesia Post Note  Patient: Kristi Roth  Procedure(s) Performed: AN AD HOC LABOR EPIDURAL     Patient location during evaluation: Mother Baby Anesthesia Type: Epidural Level of consciousness: awake Pain management: satisfactory to patient Vital Signs Assessment: post-procedure vital signs reviewed and stable Respiratory status: spontaneous breathing Cardiovascular status: stable Anesthetic complications: no    Last Vitals:  Vitals:   02/23/20 0825 02/23/20 0921  BP: 116/61 110/70  Pulse: 90 99  Resp: 18 18  Temp: 37.2 C 37.1 C  SpO2:  100%    Last Pain:  Vitals:   02/23/20 1038  TempSrc:   PainSc: 9    Pain Goal:                   [employer]
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
## 2020-02-23 NOTE — Anesthesia Procedure Notes (Addendum)
Epidural Patient location during procedure: OB Start time: 02/23/2020 2:25 AM End time: 02/23/2020 2:28 AM  Staffing Anesthesiologist: Leilani Able, MD Performed: anesthesiologist   Preanesthetic Checklist Completed: patient identified, IV checked, site marked, risks and benefits discussed, surgical consent, monitors and equipment checked, pre-op evaluation and timeout performed  Epidural Patient position: sitting Prep: DuraPrep and site prepped and draped Patient monitoring: continuous pulse ox and blood pressure Approach: midline Location: L3-L4 Injection technique: LOR air  Needle:  Needle type: Tuohy  Needle gauge: 17 G Needle length: 9 cm and 9 Needle insertion depth: 8 cm Catheter type: closed end flexible Catheter size: 19 Gauge Catheter at skin depth: 13 cm Test dose: negative and Other  Assessment Events: blood not aspirated, injection not painful, no injection resistance, no paresthesia and negative IV test  Additional Notes Reason for block:procedure for pain

## 2020-02-23 NOTE — Discharge Summary (Addendum)
Postpartum Discharge Summary      Patient Name: Kristi Roth DOB: 23-Jul-1996 MRN: [id number]  Date of admission: 02/22/2020 Delivering Provider: Clarnce Flock   Date of discharge: 02/25/2020  Admitting diagnosis: Abruptio placenta, third trimester [O45.93] Intrauterine pregnancy: [redacted]w[redacted]d    Secondary diagnosis:  Active Problems:   Supervision of high-risk pregnancy   GBS bacteriuria   Vaginal bleeding in pregnancy, third trimester   Abruptio placenta, third trimester  Additional problems: 3rd-degree laceration     Discharge diagnosis: Term Pregnancy Delivered                                                                                                Post partum procedures:  None  Augmentation: Pitocin  Complications: ROBS>96hours  Hospital course:  Induction of Labor With Vaginal Delivery   24y.o. yo G1P1001 at 347w0das admitted to the hospital 02/22/2020 for induction of labor.  Indication for induction: PROM.  Patient had an uncomplicated labor course as follows: had been followed during pregnancy for chronic placental abruption. Arrived with ROM possibly 2 days prior at 1cm, progressed spontaneously to 4cm and then was augmented with pitocin and progressed to complete.  Membrane Rupture Time/Date: 12:00 PM ,02/20/2020   Intrapartum Procedures: Episiotomy: None [1]                                         Lacerations:  3rd degree [4]  Patient had delivery of a Viable infant.  Information for the patient's newborn:  PaSallye, Lunz0[[id number]]Delivery Method: Vaginal, Spontaneous(Filed from Delivery Summary)    02/23/2020  Details of delivery can be found in separate delivery note.  Patient had a routine postpartum course. Patient is discharged home 02/25/20. Delivery time: 5:44 AM    Magnesium Sulfate received: No BMZ received: No Rhophylac:N/A MMR:N/A Transfusion:No  Physical exam  Vitals:   02/23/20 2200 02/24/20 0516 02/24/20 2009 02/25/20 0535  BP:  104/62 114/64 (!) 114/52 116/64  Pulse: 89 82  73  Resp: 20 18    Temp: 98.8 F (37.1 C) 99.1 F (37.3 C) 98.4 F (36.9 C)   TempSrc: Oral Oral Oral   SpO2:  100% 100%   Weight:      Height:       General: alert, cooperative and no distress Lochia: appropriate Uterine Fundus: firm Incision: N/A DVT Evaluation: No evidence of DVT seen on physical exam. No significant calf/ankle edema. Labs: Lab Results  Component Value Date   WBC 8.4 02/22/2020   HGB 14.0 02/22/2020   HCT 40.9 02/22/2020   MCV 90.3 02/22/2020   PLT 218 02/22/2020   CMP Latest Ref Rng & Units 12/26/2019  Glucose 70 - 99 mg/dL 73  BUN 6 - 20 mg/dL 6  Creatinine 0.44 - 1.00 mg/dL 0.57  Sodium 135 - 145 mmol/L 133(L)  Potassium 3.5 - 5.1 mmol/L 3.7  Chloride 98 - 111 mmol/L 103  CO2 22 - 32 mmol/L 22  Calcium 8.9 -  10.3 mg/dL 8.8(L)  Total Protein 6.5 - 8.1 g/dL -  Total Bilirubin 0.3 - 1.2 mg/dL -  Alkaline Phos 38 - 126 U/L -  AST 15 - 41 U/L -  ALT 14 - 54 U/L -   Edinburgh Score: Edinburgh Postnatal Depression Scale Screening Tool 02/24/2020  I have been able to laugh and see the funny side of things. 0  I have looked forward with enjoyment to things. 0  I have blamed myself unnecessarily when things went wrong. 1  I have been anxious or worried for no good reason. 2  I have felt scared or panicky for no good reason. 1  Things have been getting on top of me. 1  I have been so unhappy that I have had difficulty sleeping. 1  I have felt sad or miserable. 1  I have been so unhappy that I have been crying. 1  The thought of harming myself has occurred to me. 0  Edinburgh Postnatal Depression Scale Total 8    Discharge instruction: per After Visit Summary and "Baby and Me Booklet".  After visit meds:  Allergies as of 02/25/2020       Reactions   Latex Dermatitis        Medication List     TAKE these medications    bisacodyl 5 MG EC tablet Commonly known as: DULCOLAX Take 1 tablet (5  mg total) by mouth daily as needed for moderate constipation.   Blood Pressure Monitor Kit 1 Device by Does not apply route once a week. To be monitored Regularly at home. Dx Code Z34.0   cyclobenzaprine 10 MG tablet Commonly known as: FLEXERIL Take 1 tablet (10 mg total) by mouth 3 (three) times daily as needed for muscle spasms.   hydrocortisone cream 1 % Apply 1 application topically 2 (two) times daily.   Misc. Devices Misc Dispense one maternity belt for patient   polyethylene glycol 17 g packet Commonly known as: MIRALAX / GLYCOLAX Take 17 g by mouth daily.   prenatal multivitamin Tabs tablet Take 1 tablet by mouth daily at 12 noon.        Diet: routine diet  Activity: Advance as tolerated. Pelvic rest for 6 weeks.   Outpatient follow up:6 weeks Follow up Appt: Future Appointments  Date Time Provider [place]  04/05/2020  1:00 PM Lajean Manes, CNM [facility] None    Please schedule this patient for Postpartum visit in: 6 weeks with the following provider: Any provider In-Person For C/S patients schedule nurse incision check in weeks 2 weeks: no High risk pregnancy complicated by: chronic placental abruption, prolonged ROM Delivery mode:  SVD Anticipated Birth Control:  other/unsure PP Procedures needed: none  Schedule Integrated [place] visit: no   Newborn Data: Live born female  Birth Weight: 7 lb 12.3 oz (3524 g) APGAR: 8, 9  Newborn Delivery   Birth date/time: 02/23/2020 05:44:00 Delivery type: Vaginal, Spontaneous      Baby Feeding: Breast Disposition:home with mother   02/25/2020 Daisy Floro, DO  I personally saw and evaluated the patient, performing the key elements of the service. I developed and verified the management plan that is described in the resident's/student's note, and I agree with the content with my edits above. VSS, HRR&R, Resp unlabored, Legs neg.  Nigel Berthold, CNM 02/25/2020 1:33 PM

## 2020-02-24 NOTE — Lactation Note (Signed)
This note was copied from a baby's chart. Lactation Consultation Note Mom had called out for [facility]. Mom sleeping when LC went into room.  Patient Name: Kristi Roth Date: 02/24/2020     Maternal Data    Feeding Feeding Type: Breast Fed  LATCH Score                   Interventions    Lactation Tools Discussed/Used     Consult Status      Kristi Roth 02/24/2020, 12:41 AM

## 2020-02-24 NOTE — Clinical Social Work Maternal (Signed)
CLINICAL SOCIAL WORK MATERNAL/CHILD NOTE  Patient Details  Name: TECORA EUSTACHE MRN: [id number] Date of Birth: 10-Dec-1996  Date:  02/24/2020  Clinical Social Worker Initiating Note:  Elijio Miles Date/Time: Initiated:  02/24/20/1048     Child's Name:  [facility]   Biological Parents:  Mother, Father(Zalea Bilotta and [place])   Need for Interpreter:  None   Reason for Referral:  Other (Comment)(Assess for needs and supports)   Address:  [address], [place], [place]   Phone number:  [phone] (home)     Additional phone number:   Household Members/Support Persons (HM/SP):       HM/SP Name Relationship DOB or Age  HM/SP -1        HM/SP -2        HM/SP -3        HM/SP -4        HM/SP -5        HM/SP -6        HM/SP -7        HM/SP -8          Natural Supports (not living in the home):  Immediate Family, Spouse/significant other   Professional Supports: None   Employment: Full-time   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   Homebound arranged:    Museum/gallery curator Resources:  Medicaid   Other Resources:  Physicist, medical , [place] Considerations Which May Impact Care:    Strengths:  Ability to meet basic needs , Home prepared for child , Pediatrician chosen   Psychotropic Medications:         Pediatrician:    [place] area  Pediatrician List:   [facility] [place]      Pediatrician Fax Number:    Risk Factors/Current Problems:      Cognitive State:  Able to Concentrate , Alert , Linear Thinking    Mood/Affect:  Calm , Comfortable , Relaxed    CSW Assessment:  CSW received consult to assess for needs and supports. CSW met with MOB to offer support and complete assessment.    MOB in the bathroom getting ready with infant asleep in bassinet, when CSW entered the room. CSW  offered to come back at a later time but MOB welcoming of [place] visit. MOB spent majority of time in the bathroom and was not directly facing CSW during assessment but was pleasant and answered questions appropriately. MOB reported she currently lives alone and works full-time at [employer]. MOB confirmed she receives both [facility] and food stamps and is aware she would need to update them both of her delivery. CSW inquired about MOB's mental health history and MOB acknowledged some general anxiety during her pregnancy but stated it was nothing that wasn't manageable. CSW provided education regarding the baby blues period vs. perinatal mood disorders, discussed treatment and gave resources for mental health follow up if concerns arise. CSW recommended self-evaluation during the postpartum time period using the New Mom Checklist from Postpartum Progress and encouraged MOB to contact a medical professional if symptoms are noted at any time. MOB did not appear to be displaying any acute mental health symptoms and denied any current SI, HI or DV. MOB reported primary supports as FOB and her sister. CSW offered to make [facility] and [facility] referrals to  offer additional support to MOB and infant once discharged but MOB declined at this time. Per MOB, she was active with [facility] during her pregnancy but reported a negative experience.   MOB confirmed having all essential items for infant once discharged and stated infant would be sleeping in a bassinet once home. CSW provided review of Sudden Infant Death Syndrome (SIDS) precautions and safe sleeping habits.     CSW Plan/Description:  No Further Intervention Required/No Barriers to Discharge, Sudden Infant Death Syndrome (SIDS) Education, Perinatal Mood and Anxiety Disorder (PMADs) Education    [place], LCSW 02/24/2020, 11:27 AM

## 2020-02-24 NOTE — Progress Notes (Signed)
Patient accidentally spilled entire cup with apple juice with Miralax added. Will give Miralax again.

## 2020-02-24 NOTE — Lactation Note (Signed)
This note was copied from a baby's chart. Lactation Consultation Note Baby 21 hrs old. Mom had asked for [facility] for questions. Mom BF baby when [facility] entered room. Mom has good latch. Discussed body positioning, support while feeding. Baby swaddled. encouraged to uncover baby for next feeding. Answered mom's questions. Discussed breast massaging occasionally while feeding. Praised mom.  Patient Name: Kristi Roth UXNAT'F Date: 02/24/2020 Reason for consult: Mother's request;Primapara;Term   Maternal Data    Feeding Feeding Type: Breast Fed  LATCH Score Latch: Grasps breast easily, tongue down, lips flanged, rhythmical sucking.  Audible Swallowing: A few with stimulation  Type of Nipple: Everted at rest and after stimulation(short shaft)  Comfort (Breast/Nipple): Soft / non-tender  Hold (Positioning): Assistance needed to correctly position infant at breast and maintain latch.  LATCH Score: 8  Interventions Interventions: Breast feeding basics reviewed;Support pillows;Assisted with latch;Position options;Skin to skin;Breast massage;Hand express;Breast compression;Adjust position  Lactation Tools Discussed/Used     Consult Status Consult Status: Follow-up Date: 02/24/20 Follow-up type: In-patient    Charyl Dancer 02/24/2020, 3:35 AM

## 2020-02-24 NOTE — Progress Notes (Signed)
POSTPARTUM PROGRESS NOTE  Post Partum Day 1  Subjective:  Kristi Roth is a 24 y.o. G1P1001 s/p NSVD at [redacted]w[redacted]d.  She reports she is doing well. No acute events overnight. She denies any problems with ambulating, voiding or po intake. Denies nausea or vomiting.  Pain is moderately controlled.  Lochia is appropriate.  Objective: Blood pressure 114/64, pulse 82, temperature 99.1 F (37.3 C), temperature source Oral, resp. rate 18, height 5\' 7"  (1.702 m), weight 89.4 kg, last menstrual period 05/26/2019, SpO2 100 %, unknown if currently breastfeeding.  Physical Exam:  General: alert, cooperative and no distress Chest: no respiratory distress Heart:regular rate, distal pulses intact Abdomen: soft, nontender,  Uterine Fundus: firm, appropriately tender DVT Evaluation: No calf swelling or tenderness Extremities: no LE edema Skin: warm, dry  Recent Labs    02/22/20 1733  HGB 14.0  HCT 40.9    Assessment/Plan: CLAIRISSA VALVANO is a 24 y.o. G1P1001 s/p NSVD at [redacted]w[redacted]d   PPD#1 - Doing well  Routine postpartum care 3rd degree: on bowel regimen Contraception: declines Feeding: breast Dispo: Plan for discharge PPD#2.   LOS: 2 days   [redacted]w[redacted]d, MD/[facility] OB Fellow  02/24/2020, 8:07 AM

## 2020-02-24 NOTE — Lactation Note (Signed)
This note was copied from a baby's chart. Lactation Consultation Note  Patient Name: Boy Martinique Pizzimenti OPPUG'G Date: 02/24/2020 Reason for consult: Follow-up assessment  P1 mother whose infant is now 8 hours old.  This is a term baby.  Mother had recently finished breast feeding and baby was swaddled and asleep.  Mother had no questions/concerns related to breast feeding.  Encouraged to continue feeding 8-12 times/24 hours or sooner if baby shows feeding cues.  Mother is familiar with hand expression and will continue feeding back any EBM she obtains to baby.    Mother does not have a DEBP at home and plans to eventually return to work.  She is a [employer] participant and I suggested she contact the [facility] office first thing in the a.m. to determine eligibility.  Mother plans to do this.  Informed her that we will provide a manual pump, however, this will not be adequate for maintaining a good milk supply.  No support person present at this time.   Maternal Data    Feeding Feeding Type: Breast Fed  LATCH Score                   Interventions    Lactation Tools Discussed/Used     Consult Status Consult Status: Follow-up Date: 02/25/20 Follow-up type: In-patient    Orby Tangen R Ousmane Seeman 02/24/2020, 6:08 PM

## 2020-02-25 ENCOUNTER — Inpatient Hospital Stay (HOSPITAL_COMMUNITY): Payer: Medicaid Other

## 2020-02-25 ENCOUNTER — Inpatient Hospital Stay (HOSPITAL_COMMUNITY)
Admission: AD | Admit: 2020-02-25 | Payer: Medicaid Other | Source: Home / Self Care | Admitting: Obstetrics & Gynecology

## 2020-02-25 MED ORDER — POLYETHYLENE GLYCOL 3350 17 G PO PACK: 17.0000 g | PACK | Freq: Every day | ORAL | 0 refills | Status: DC

## 2020-02-25 MED ORDER — BISACODYL 5 MG PO TBEC: 5.0000 mg | DELAYED_RELEASE_TABLET | Freq: Every day | ORAL | 0 refills | Status: DC | PRN

## 2020-02-25 MED FILL — BISACODYL EC 5 MG TBEC: 5 | 30 days supply | Qty: 30 | Fill #0

## 2020-02-25 MED FILL — POLYETHYLENE GLYCOL 3350 PO: 17 | 14 days supply | Qty: 238 | Fill #0

## 2020-02-25 NOTE — Lactation Note (Addendum)
This note was copied from a baby's chart. Lactation Consultation Note:  Mother reports that she is using her hand pump after breastfeeding. Mother had lots of questions about pumping.   Mother is active with [facility] and phone them today to secure a DEBP. She plans to go back to workother reports that infant cluster fed frequently last night and staff nurse gave mother a pacifier.   Mother reports that infant recently breastfed for 25 mins. Infant is crying and mother attempting to give pacifier again.  Discussed feeding cues and making milk .  Mother advised to feed infant with all feeding cues.  Discussed treatment and prevention of engorgement.  Encouraged mother to breastfeed infant at least 8-12 times or more in 24 hours. Mother to continue to do frequent STS. Mother very receptive to all teaching.  Mother is aware of available LC services and community support.  Mother to phone LC hot line as needed for questions or concerns.   Patient Name: Kristi Roth SLPNP'Y Date: 02/25/2020 Reason for consult: Follow-up assessment   Maternal Data    Feeding Feeding Type: Breast Fed  LATCH Score                   Interventions Interventions: Hand express;Hand pump;Ice  Lactation Tools Discussed/Used     Consult Status Consult Status: Complete    Michel Bickers 02/25/2020, 11:11 AM

## 2020-03-09 ENCOUNTER — Encounter: Payer: Self-pay | Admitting: Certified Nurse Midwife

## 2020-04-05 ENCOUNTER — Encounter: Payer: Self-pay | Admitting: Certified Nurse Midwife

## 2020-04-05 ENCOUNTER — Other Ambulatory Visit: Payer: Self-pay

## 2020-04-05 ENCOUNTER — Ambulatory Visit (INDEPENDENT_AMBULATORY_CARE_PROVIDER_SITE_OTHER): Payer: Medicaid Other | Admitting: Certified Nurse Midwife

## 2020-04-05 DIAGNOSIS — Z3009 Encounter for other general counseling and advice on contraception: Secondary | ICD-10-CM

## 2020-04-05 NOTE — Progress Notes (Signed)
..     Post Partum Visit Note  Kristi Roth is a 24 y.o. G18P1001 female who presents for a postpartum visit. She is 6 weeks postpartum following a normal spontaneous vaginal delivery.  I have fully reviewed the prenatal and intrapartum course. The delivery was at 39.0 gestational weeks.  Anesthesia: epidural. Postpartum course has been good. Baby is doing well good. Baby is feeding by both breast and bottle - Infamil. Bleeding no bleeding. Bowel function is normal. Bladder function is normal. Patient is not sexually active. Contraception method is none. Postpartum depression screening: positive.  The following portions of the patient's history were reviewed and updated as appropriate: allergies, current medications, past medical history, past surgical history and problem list.  Review of Systems A comprehensive review of systems was negative.    Objective:  Blood pressure 119/68, pulse 83, height 5\' 7"  (1.702 m), weight 188 lb (85.3 kg), last menstrual period 05/26/2019, currently breastfeeding.  General:  alert, cooperative and no distress   Breasts:  inspection negative, no nipple discharge or bleeding, no masses or nodularity palpable  Lungs: clear to auscultation bilaterally  Heart:  regular rate and rhythm  Abdomen: soft, non-tender; bowel sounds normal; no masses,  no organomegaly   Vulva:  normal  Vagina:  3B laceration healing well   Cervix:  not evaluated  Corpus: not examined  Adnexa:  not evaluated  Rectal Exam: Not performed.        Assessment:  1. Postpartum care and examination - Normal postpartum examination - pap up to date, normal on 07/2019  2. Birth control counseling - Educated and discussed birth control options in detail. Patient reports she is not having intercourse right now and plan to use condoms when she does as she does not want to get pregnant.   Plan:   Essential components of care per ACOG recommendations:  1.  Mood and well being: Patient with  negative depression screening today. Reviewed local resources for support.  - Patient does not use tobacco.  2. Infant care and feeding:  -Patient currently breastmilk feeding? Yes , discussed return to work and pumping. Patient reports that she is supplementing with formula when she is at work. Reviewed importance of draining breast regularly to support lactation.   3. Sexuality, contraception and birth spacing - Patient does not want a pregnancy in the next year.   - Reviewed forms of contraception in tiered fashion. Patient desired condoms today.   - Discussed birth spacing of 18 months  4. Sleep and fatigue -Encouraged family/partner/community support of 4 hrs of uninterrupted sleep to help with mood and fatigue  5. Physical Recovery  - Discussed patients delivery and complications - Patient had a 3B degree laceration, perineal healing reviewed. Patient expressed understanding - Patient has urinary incontinence? No - Patient is safe to resume physical and sexual activity  6.  Health Maintenance - Last pap smear done 07/2019 and was normal with negative HPV.   08/2019, CNM [facility], [facility]

## 2020-10-03 ENCOUNTER — Ambulatory Visit
Admission: EM | Admit: 2020-10-03 | Discharge: 2020-10-03 | Disposition: A | Discharge status: Home / Self Care | Payer: Medicaid Other | Attending: Emergency Medicine | Admitting: Emergency Medicine

## 2020-10-03 ENCOUNTER — Other Ambulatory Visit: Payer: Self-pay

## 2020-10-03 DIAGNOSIS — Z113 Encounter for screening for infections with a predominantly sexual mode of transmission: Secondary | ICD-10-CM

## 2020-10-03 DIAGNOSIS — Z7251 High risk heterosexual behavior: Secondary | ICD-10-CM | POA: Diagnosis not present

## 2020-10-03 DIAGNOSIS — H60391 Other infective otitis externa, right ear: Secondary | ICD-10-CM

## 2020-10-03 MED ORDER — NEOMYCIN-POLYMYXIN-HC 1 % OT SOLN: 3.0000 [drp] | Freq: Three times a day (TID) | OTIC | 0 refills | Status: DC
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
## 2020-10-03 NOTE — ED Triage Notes (Addendum)
Pt presents with complaints of pain in her chest, back, right hip, head, right ear, and "walls of vagina". That started Saturday night. Pt denies trying any medication for the pain because it is bearable and on and off. Reports the pain started in her ear then spread to the other locations. Pt denies pain at this time. Sitting makes the vaginal and hip pain worse. Eating and yawning makes the head and ear pain worse.

## 2020-10-03 NOTE — Discharge Instructions (Addendum)

## 2020-10-04 ENCOUNTER — Telehealth (HOSPITAL_COMMUNITY): Payer: Self-pay | Admitting: Emergency Medicine

## 2020-10-04 LAB — CERVICOVAGINAL ANCILLARY ONLY
Bacterial Vaginitis (gardnerella): POSITIVE — AB
Candida Glabrata: NEGATIVE
Candida Vaginitis: NEGATIVE
Chlamydia: NEGATIVE
Comment: NEGATIVE
Comment: NEGATIVE
Comment: NEGATIVE
Comment: NEGATIVE
Comment: NEGATIVE
Comment: NORMAL
Neisseria Gonorrhea: NEGATIVE
Trichomonas: NEGATIVE

## 2020-10-04 MED ORDER — METRONIDAZOLE 500 MG PO TABS: 500.0000 mg | ORAL_TABLET | Freq: Two times a day (BID) | ORAL | 0 refills | Status: DC

## 2020-11-25 ENCOUNTER — Encounter: Payer: Self-pay | Admitting: *Deleted

## 2020-11-25 ENCOUNTER — Other Ambulatory Visit: Payer: Self-pay

## 2020-11-25 ENCOUNTER — Ambulatory Visit
Admission: EM | Admit: 2020-11-25 | Discharge: 2020-11-25 | Disposition: A | Discharge status: Home / Self Care | Payer: Medicaid Other | Attending: Emergency Medicine | Admitting: Emergency Medicine

## 2020-11-25 DIAGNOSIS — Z3A01 Less than 8 weeks gestation of pregnancy: Secondary | ICD-10-CM | POA: Diagnosis not present

## 2020-11-25 DIAGNOSIS — R300 Dysuria: Secondary | ICD-10-CM | POA: Diagnosis not present

## 2020-11-25 LAB — POCT URINALYSIS DIP (MANUAL ENTRY)
Bilirubin, UA: NEGATIVE
Blood, UA: NEGATIVE
Glucose, UA: NEGATIVE mg/dL
Ketones, POC UA: NEGATIVE mg/dL
Nitrite, UA: NEGATIVE
Spec Grav, UA: 1.025 (ref 1.010–1.025)
Urobilinogen, UA: 1 E.U./dL
pH, UA: 7 (ref 5.0–8.0)

## 2020-11-25 LAB — POCT URINE PREGNANCY: Preg Test, Ur: POSITIVE — AB

## 2020-11-25 MED ORDER — CEPHALEXIN 500 MG PO CAPS: 500.0000 mg | ORAL_CAPSULE | Freq: Two times a day (BID) | ORAL | 0 refills | Status: AC

## 2020-11-25 NOTE — ED Notes (Signed)
Pt provided PO fluids so she can provide urine sample.

## 2020-11-25 NOTE — ED Triage Notes (Signed)
Pt states she is pregnant -- states "maybe about 8 wks" along; has not had OB appt "because I don't plan to keep it".  Started with vaginal bleeding approx 2 nights ago; states lasted through yesterday, then completely resolved.
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
## 2020-11-28 LAB — CERVICOVAGINAL ANCILLARY ONLY
Bacterial Vaginitis (gardnerella): POSITIVE — AB
Chlamydia: NEGATIVE
Comment: NEGATIVE
Comment: NEGATIVE
Comment: NEGATIVE
Comment: NORMAL
Neisseria Gonorrhea: NEGATIVE
Trichomonas: NEGATIVE

## 2020-11-28 LAB — URINE CULTURE: Culture: NO GROWTH

## 2020-11-29 ENCOUNTER — Telehealth (HOSPITAL_COMMUNITY): Payer: Self-pay | Admitting: Emergency Medicine

## 2020-11-29 MED ORDER — METRONIDAZOLE 0.75 % VA GEL: 1.0000 | Freq: Every day | VAGINAL | 0 refills | Status: AC

## 2020-12-25 ENCOUNTER — Other Ambulatory Visit: Payer: Self-pay

## 2020-12-25 ENCOUNTER — Inpatient Hospital Stay (HOSPITAL_COMMUNITY)
Admission: AD | Admit: 2020-12-25 | Discharge: 2020-12-25 | Disposition: A | Discharge status: Home / Self Care | Payer: Medicaid Other | Attending: Family Medicine | Admitting: Family Medicine

## 2020-12-25 NOTE — MAU Note (Signed)
Called pt in lobby x3 to triage. Was not present.

## 2020-12-30 NOTE — L&D Delivery Note (Signed)
CNM at bedside with EMS arrival. Cervix 10/100/+1 and patient involuntarily pushing.   Kristi Roth is a 25 y.o. female G2P1001 with IUP at [redacted]w[redacted]d admitted for normal labor.  She progressed without augmentation to complete and pushed 1 time to deliver.  Cord clamping delayed by several minutes then clamped by CNM and cut by patient.    Delivery Note At 6:56 AM a viable and healthy female was delivered via Vaginal, Spontaneous (Presentation: Left Occiput Anterior).  APGAR: 8, 9; weight pending.   Placenta status: Spontaneous,  intact.  Cord: 3 vessels with the following complications: None.  Anesthesia: None Episiotomy: None Lacerations:  none Suture Repair:  n/a Est. Blood Loss (mL): 300  Mom to postpartum.  Baby to Couplet care / Skin to Skin.  Rolm Bookbinder CNM 07/02/2021, 7:19 AM

## 2021-02-03 ENCOUNTER — Other Ambulatory Visit: Payer: Self-pay

## 2021-02-03 ENCOUNTER — Inpatient Hospital Stay (HOSPITAL_BASED_OUTPATIENT_CLINIC_OR_DEPARTMENT_OTHER): Payer: Medicaid Other

## 2021-02-03 ENCOUNTER — Encounter (HOSPITAL_COMMUNITY): Payer: Self-pay | Admitting: Obstetrics and Gynecology

## 2021-02-03 ENCOUNTER — Inpatient Hospital Stay (HOSPITAL_COMMUNITY)
Admission: AD | Admit: 2021-02-03 | Discharge: 2021-02-03 | Disposition: A | Discharge status: Home / Self Care | Payer: Medicaid Other | Attending: Obstetrics and Gynecology | Admitting: Obstetrics and Gynecology

## 2021-02-03 DIAGNOSIS — O208 Other hemorrhage in early pregnancy: Secondary | ICD-10-CM | POA: Diagnosis not present

## 2021-02-03 DIAGNOSIS — O4692 Antepartum hemorrhage, unspecified, second trimester: Secondary | ICD-10-CM

## 2021-02-03 DIAGNOSIS — O26892 Other specified pregnancy related conditions, second trimester: Secondary | ICD-10-CM | POA: Diagnosis not present

## 2021-02-03 DIAGNOSIS — R102 Pelvic and perineal pain: Secondary | ICD-10-CM | POA: Insufficient documentation

## 2021-02-03 DIAGNOSIS — Z91040 Latex allergy status: Secondary | ICD-10-CM | POA: Diagnosis not present

## 2021-02-03 DIAGNOSIS — O99892 Other specified diseases and conditions complicating childbirth: Secondary | ICD-10-CM

## 2021-02-03 DIAGNOSIS — Z3A16 16 weeks gestation of pregnancy: Secondary | ICD-10-CM | POA: Diagnosis not present

## 2021-02-03 DIAGNOSIS — O219 Vomiting of pregnancy, unspecified: Secondary | ICD-10-CM

## 2021-02-03 DIAGNOSIS — O0932 Supervision of pregnancy with insufficient antenatal care, second trimester: Secondary | ICD-10-CM

## 2021-02-03 DIAGNOSIS — O209 Hemorrhage in early pregnancy, unspecified: Secondary | ICD-10-CM | POA: Diagnosis not present

## 2021-02-03 DIAGNOSIS — R519 Headache, unspecified: Secondary | ICD-10-CM | POA: Insufficient documentation

## 2021-02-03 DIAGNOSIS — Z3A17 17 weeks gestation of pregnancy: Secondary | ICD-10-CM | POA: Diagnosis not present

## 2021-02-03 LAB — URINALYSIS, ROUTINE W REFLEX MICROSCOPIC
Bilirubin Urine: NEGATIVE
Glucose, UA: NEGATIVE mg/dL
Ketones, ur: 80 mg/dL — AB
Nitrite: NEGATIVE
Protein, ur: 30 mg/dL — AB
RBC / HPF: >50 RBC/hpf — ABNORMAL HIGH (ref 0–5)
Specific Gravity, Urine: 1.029 (ref 1.005–1.030)
pH: 5 (ref 5.0–8.0)

## 2021-02-03 LAB — CBC WITH DIFFERENTIAL/PLATELET
Abs Immature Granulocytes: 0.02 10*3/uL (ref 0.00–0.07)
Basophils Absolute: 0 10*3/uL (ref 0.0–0.1)
Basophils Relative: 1 %
Eosinophils Absolute: 0 10*3/uL (ref 0.0–0.5)
Eosinophils Relative: 0 %
HCT: 36.5 % (ref 36.0–46.0)
Hemoglobin: 12.6 g/dL (ref 12.0–15.0)
Immature Granulocytes: 0 %
Lymphocytes Relative: 25 %
Lymphs Abs: 1.6 10*3/uL (ref 0.7–4.0)
MCH: 29.9 pg (ref 26.0–34.0)
MCHC: 34.5 g/dL (ref 30.0–36.0)
MCV: 86.5 fL (ref 80.0–100.0)
Monocytes Absolute: 0.4 10*3/uL (ref 0.1–1.0)
Monocytes Relative: 6 %
Neutro Abs: 4.4 10*3/uL (ref 1.7–7.7)
Neutrophils Relative %: 68 %
Platelets: 258 10*3/uL (ref 150–400)
RBC: 4.22 MIL/uL (ref 3.87–5.11)
RDW: 12 % (ref 11.5–15.5)
WBC: 6.5 10*3/uL (ref 4.0–10.5)
nRBC: 0 % (ref 0.0–0.2)

## 2021-02-03 LAB — COMPREHENSIVE METABOLIC PANEL
ALT: 9 U/L (ref 0–44)
AST: 18 U/L (ref 15–41)
Albumin: 3.3 g/dL — ABNORMAL LOW (ref 3.5–5.0)
Alkaline Phosphatase: 53 U/L (ref 38–126)
Anion gap: 11 (ref 5–15)
BUN: 5 mg/dL — ABNORMAL LOW (ref 6–20)
CO2: 18 mmol/L — ABNORMAL LOW (ref 22–32)
Calcium: 8.7 mg/dL — ABNORMAL LOW (ref 8.9–10.3)
Chloride: 105 mmol/L (ref 98–111)
Creatinine, Ser: 0.56 mg/dL (ref 0.44–1.00)
GFR, Estimated: >60 mL/min (ref >60)
Glucose, Bld: 109 mg/dL — ABNORMAL HIGH (ref 70–99)
Potassium: 3.3 mmol/L — ABNORMAL LOW (ref 3.5–5.1)
Sodium: 134 mmol/L — ABNORMAL LOW (ref 135–145)
Total Bilirubin: 1.1 mg/dL (ref 0.3–1.2)
Total Protein: 6.4 g/dL — ABNORMAL LOW (ref 6.5–8.1)

## 2021-02-03 MED ORDER — CYCLOBENZAPRINE HCL 10 MG PO TABS: 10.0000 mg | ORAL_TABLET | Freq: Two times a day (BID) | ORAL | 0 refills | Status: DC | PRN

## 2021-02-03 MED ORDER — CYCLOBENZAPRINE HCL 5 MG PO TABS
10.0000 mg | ORAL_TABLET | Freq: Once | ORAL | Status: AC
  Administered 2021-02-03: 10 mg via ORAL
  Filled 2021-02-03: qty 2

## 2021-02-03 MED ORDER — LACTATED RINGERS IV BOLUS
1000.0000 mL | Freq: Once | INTRAVENOUS | Status: AC
  Administered 2021-02-03: 1000 mL via INTRAVENOUS

## 2021-02-03 MED ORDER — ONDANSETRON 4 MG PO TBDP
8.0000 mg | ORAL_TABLET | Freq: Once | ORAL | Status: AC
  Administered 2021-02-03: 8 mg via ORAL
  Filled 2021-02-03: qty 2

## 2021-02-03 MED ORDER — ONDANSETRON 4 MG PO TBDP: 4.0000 mg | ORAL_TABLET | Freq: Three times a day (TID) | ORAL | 0 refills | Status: DC | PRN

## 2021-02-03 NOTE — MAU Note (Signed)
This RN called pt from lobby. Pt not in lobby.

## 2021-02-03 NOTE — MAU Note (Signed)
Pt reports to mau with c/o nausea and vomiting, and cramping since 11pm yesterday.  Pt states she has also had several episodes of diarrhea today.  Pt reports she woke up from a nap in a puddle of blood around 1600 today. Pt states she has not started prenatal care and is not sure how far along she is.
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
## 2021-02-03 NOTE — Discharge Instructions (Signed)

## 2021-03-01 ENCOUNTER — Ambulatory Visit (INDEPENDENT_AMBULATORY_CARE_PROVIDER_SITE_OTHER): Payer: Medicaid Other | Admitting: *Deleted

## 2021-03-01 DIAGNOSIS — Z3A00 Weeks of gestation of pregnancy not specified: Secondary | ICD-10-CM

## 2021-03-01 DIAGNOSIS — Z3A20 20 weeks gestation of pregnancy: Secondary | ICD-10-CM

## 2021-03-01 DIAGNOSIS — Z3480 Encounter for supervision of other normal pregnancy, unspecified trimester: Secondary | ICD-10-CM

## 2021-03-01 NOTE — Progress Notes (Signed)
Virtual Visit via Telephone Note  I connected with Kristi Roth on 03/01/21 at  9:00 AM EST by telephone and verified that I am speaking with the correct person using two identifiers.  Location: Patient: home  Provider: in office     I provided 20 minutes of non-face-to-face time during this encounter.   PRENATAL INTAKE SUMMARY  Kristi Roth presents today New OB Nurse Interview.  OB History    Gravida  2   Para  1   Term  1   Preterm  0   AB  0   Living  1     SAB  0   IAB      Ectopic  0   Multiple  0   Live Births  1          I have reviewed the patient's medical, obstetrical, social, and family histories, medications, and available lab results.  SUBJECTIVE She has no unusual complaints  OBJECTIVE Initial Physical Exam (New OB)  GENERAL APPEARANCE: alert and sounds well via telephone call   ASSESSMENT Normal pregnancy  Depression screen [facility] 2/9 03/01/2021  Decreased Interest 3  Down, Depressed, Hopeless 1  PHQ - 2 Score 4  Altered sleeping 1  Tired, decreased energy 3  Change in appetite 1  Feeling bad or failure about yourself  0  Trouble concentrating 1  Moving slowly or fidgety/restless 0  Suicidal thoughts 0  PHQ-9 Score 10  Difficult doing work/chores Somewhat difficult     PLAN  Prenatal care [name] Pt has BP cuff at home Orders Placed This Encounter  Procedures  . [place] MFM OB DETAIL +14 WK  . Ambulatory referral to [facility]

## 2021-03-01 NOTE — Progress Notes (Signed)
Agree with A & P. 

## 2021-03-05 ENCOUNTER — Other Ambulatory Visit (HOSPITAL_COMMUNITY)
Admission: RE | Admit: 2021-03-05 | Discharge: 2021-03-05 | Disposition: A | Discharge status: Home / Self Care | Payer: Medicaid Other | Source: Ambulatory Visit | Attending: Obstetrics | Admitting: Obstetrics

## 2021-03-05 ENCOUNTER — Ambulatory Visit (INDEPENDENT_AMBULATORY_CARE_PROVIDER_SITE_OTHER): Payer: Medicaid Other | Admitting: Obstetrics

## 2021-03-05 ENCOUNTER — Encounter: Payer: Self-pay | Admitting: Obstetrics

## 2021-03-05 ENCOUNTER — Other Ambulatory Visit: Payer: Self-pay

## 2021-03-05 VITALS — BP 134/62 | HR 102 | Wt 193.0 lb

## 2021-03-05 DIAGNOSIS — Z3A21 21 weeks gestation of pregnancy: Secondary | ICD-10-CM

## 2021-03-05 DIAGNOSIS — Z3480 Encounter for supervision of other normal pregnancy, unspecified trimester: Secondary | ICD-10-CM | POA: Insufficient documentation

## 2021-03-05 DIAGNOSIS — Z3482 Encounter for supervision of other normal pregnancy, second trimester: Secondary | ICD-10-CM

## 2021-03-05 MED ORDER — VITAFOL ULTRA 29-0.6-0.4-200 MG PO CAPS: 1.0000 | ORAL_CAPSULE | Freq: Every day | ORAL | 4 refills | Status: DC

## 2021-03-05 NOTE — Progress Notes (Signed)
NOB  NOB Intake done on 03/01/21  Planned : No   Genetic Screening:yes does not want to know gender.   Needs refill on PNV   Last pap:08/26/2019 WNL   CC: Hip pain
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
## 2021-03-06 LAB — CERVICOVAGINAL ANCILLARY ONLY
Bacterial Vaginitis (gardnerella): POSITIVE — AB
Candida Glabrata: NEGATIVE
Candida Vaginitis: POSITIVE — AB
Chlamydia: NEGATIVE
Comment: NEGATIVE
Comment: NEGATIVE
Comment: NEGATIVE
Comment: NEGATIVE
Comment: NEGATIVE
Comment: NORMAL
Neisseria Gonorrhea: NEGATIVE
Trichomonas: NEGATIVE

## 2021-03-07 ENCOUNTER — Other Ambulatory Visit: Payer: Self-pay | Admitting: Obstetrics

## 2021-03-07 DIAGNOSIS — N760 Acute vaginitis: Secondary | ICD-10-CM

## 2021-03-07 DIAGNOSIS — B3731 Acute candidiasis of vulva and vagina: Secondary | ICD-10-CM

## 2021-03-07 DIAGNOSIS — B373 Candidiasis of vulva and vagina: Secondary | ICD-10-CM

## 2021-03-07 DIAGNOSIS — B9689 Other specified bacterial agents as the cause of diseases classified elsewhere: Secondary | ICD-10-CM

## 2021-03-07 LAB — CBC/D/PLT+RPR+RH+ABO+RUB AB...
Antibody Screen: NEGATIVE
Basophils Absolute: 0.1 10*3/uL (ref 0.0–0.2)
Basos: 1 %
EOS (ABSOLUTE): 0.1 10*3/uL (ref 0.0–0.4)
Eos: 1 %
HCV Ab: <0.1 s/co ratio (ref 0.0–0.9)
HIV Screen 4th Generation wRfx: NONREACTIVE
Hematocrit: 32.7 % — ABNORMAL LOW (ref 34.0–46.6)
Hemoglobin: 11.2 g/dL (ref 11.1–15.9)
Hepatitis B Surface Ag: NEGATIVE
Immature Grans (Abs): 0 10*3/uL (ref 0.0–0.1)
Immature Granulocytes: 0 %
Lymphocytes Absolute: 2.8 10*3/uL (ref 0.7–3.1)
Lymphs: 38 %
MCH: 30.6 pg (ref 26.6–33.0)
MCHC: 34.3 g/dL (ref 31.5–35.7)
MCV: 89 fL (ref 79–97)
Monocytes Absolute: 0.4 10*3/uL (ref 0.1–0.9)
Monocytes: 6 %
Neutrophils Absolute: 4 10*3/uL (ref 1.4–7.0)
Neutrophils: 54 %
Platelets: 249 10*3/uL (ref 150–450)
RBC: 3.66 x10E6/uL — ABNORMAL LOW (ref 3.77–5.28)
RDW: 12.7 % (ref 11.7–15.4)
RPR Ser Ql: NONREACTIVE
Rh Factor: POSITIVE
Rubella Antibodies, IGG: 11.6 index (ref >0.99)
WBC: 7.4 10*3/uL (ref 3.4–10.8)

## 2021-03-07 LAB — AFP, SERUM, OPEN SPINA BIFIDA
AFP MoM: 0.65
AFP Value: 40 ng/mL
Gest. Age on Collection Date: 21 weeks
Maternal Age At EDD: 23.3 yr
OSBR Risk 1 IN: 10000
Test Results:: NEGATIVE
Weight: 193 [lb_av]

## 2021-03-07 LAB — HCV INTERPRETATION

## 2021-03-07 MED ORDER — TERCONAZOLE 0.4 % VA CREA: 1.0000 | TOPICAL_CREAM | Freq: Every day | VAGINAL | 0 refills | Status: DC

## 2021-03-07 MED ORDER — METRONIDAZOLE 500 MG PO TABS: 500.0000 mg | ORAL_TABLET | Freq: Two times a day (BID) | ORAL | 2 refills | Status: DC

## 2021-03-08 LAB — CULTURE, OB URINE

## 2021-03-08 LAB — URINE CULTURE, OB REFLEX

## 2021-03-09 LAB — CYTOLOGY - PAP
Comment: NEGATIVE
Diagnosis: UNDETERMINED — AB
High risk HPV: NEGATIVE

## 2021-03-12 ENCOUNTER — Encounter: Payer: Self-pay | Admitting: Obstetrics

## 2021-03-13 ENCOUNTER — Encounter: Payer: Medicaid Other | Admitting: Licensed Clinical Social Worker

## 2021-03-15 ENCOUNTER — Ambulatory Visit: Payer: Medicaid Other

## 2021-03-15 ENCOUNTER — Other Ambulatory Visit: Payer: Medicaid Other

## 2021-03-20 ENCOUNTER — Encounter: Payer: Self-pay | Admitting: Obstetrics

## 2021-03-22 ENCOUNTER — Ambulatory Visit: Payer: Medicaid Other | Attending: Obstetrics and Gynecology

## 2021-03-22 ENCOUNTER — Ambulatory Visit: Payer: Medicaid Other | Admitting: *Deleted

## 2021-03-22 ENCOUNTER — Encounter: Payer: Self-pay | Admitting: *Deleted

## 2021-03-22 ENCOUNTER — Other Ambulatory Visit: Payer: Self-pay

## 2021-03-22 DIAGNOSIS — Z3480 Encounter for supervision of other normal pregnancy, unspecified trimester: Secondary | ICD-10-CM | POA: Diagnosis not present

## 2021-03-22 DIAGNOSIS — Z3A20 20 weeks gestation of pregnancy: Secondary | ICD-10-CM

## 2021-04-02 ENCOUNTER — Encounter: Payer: Medicaid Other | Admitting: Obstetrics and Gynecology

## 2021-04-09 ENCOUNTER — Inpatient Hospital Stay (HOSPITAL_COMMUNITY)
Admission: AD | Admit: 2021-04-09 | Discharge: 2021-04-09 | Disposition: A | Discharge status: Home / Self Care | Payer: Medicaid Other | Attending: Obstetrics & Gynecology | Admitting: Obstetrics & Gynecology

## 2021-04-09 ENCOUNTER — Encounter (HOSPITAL_COMMUNITY): Payer: Self-pay | Admitting: Obstetrics & Gynecology

## 2021-04-09 ENCOUNTER — Other Ambulatory Visit: Payer: Self-pay

## 2021-04-09 DIAGNOSIS — O23592 Infection of other part of genital tract in pregnancy, second trimester: Secondary | ICD-10-CM | POA: Diagnosis not present

## 2021-04-09 DIAGNOSIS — M25552 Pain in left hip: Secondary | ICD-10-CM

## 2021-04-09 DIAGNOSIS — Z3A24 24 weeks gestation of pregnancy: Secondary | ICD-10-CM | POA: Diagnosis not present

## 2021-04-09 DIAGNOSIS — Z3492 Encounter for supervision of normal pregnancy, unspecified, second trimester: Secondary | ICD-10-CM | POA: Diagnosis present

## 2021-04-09 DIAGNOSIS — B9689 Other specified bacterial agents as the cause of diseases classified elsewhere: Secondary | ICD-10-CM | POA: Diagnosis not present

## 2021-04-09 DIAGNOSIS — O26892 Other specified pregnancy related conditions, second trimester: Secondary | ICD-10-CM | POA: Diagnosis not present

## 2021-04-09 DIAGNOSIS — N760 Acute vaginitis: Secondary | ICD-10-CM

## 2021-04-09 LAB — WET PREP, GENITAL
Sperm: NONE SEEN
Trich, Wet Prep: NONE SEEN
Yeast Wet Prep HPF POC: NONE SEEN

## 2021-04-09 LAB — URINALYSIS, ROUTINE W REFLEX MICROSCOPIC
Bilirubin Urine: NEGATIVE
Glucose, UA: NEGATIVE mg/dL
Ketones, ur: 5 mg/dL — AB
Nitrite: NEGATIVE
Protein, ur: 30 mg/dL — AB
RBC / HPF: >50 RBC/hpf — ABNORMAL HIGH (ref 0–5)
Specific Gravity, Urine: 1.029 (ref 1.005–1.030)
pH: 5 (ref 5.0–8.0)

## 2021-04-09 LAB — AMNISURE RUPTURE OF MEMBRANE (ROM) NOT AT ARMC: Amnisure ROM: NEGATIVE

## 2021-04-09 MED ORDER — IBUPROFEN 800 MG PO TABS
800.0000 mg | ORAL_TABLET | Freq: Once | ORAL | Status: AC
  Administered 2021-04-09: 800 mg via ORAL
  Filled 2021-04-09: qty 1

## 2021-04-09 MED ORDER — CYCLOBENZAPRINE HCL 5 MG PO TABS
10.0000 mg | ORAL_TABLET | Freq: Once | ORAL | Status: AC
  Administered 2021-04-09: 10 mg via ORAL
  Filled 2021-04-09: qty 2

## 2021-04-09 MED ORDER — METRONIDAZOLE 500 MG PO TABS: 500.0000 mg | ORAL_TABLET | Freq: Two times a day (BID) | ORAL | 0 refills | Status: DC

## 2021-04-09 NOTE — MAU Note (Signed)
Has been having severe pain in her left hip throughout the preg.  Has been getting worse, more frequent and more severe. Around 0300, started leaking fluids, clear and tan- watery, has been coming off and on all day. No bleeding.

## 2021-04-09 NOTE — Discharge Instructions (Signed)
Hip Pain The hip is the joint between the upper legs and the lower pelvis. The bones, cartilage, tendons, and muscles of your hip joint support your body and allow you to move around. Hip pain can range from a minor ache to severe pain in one or both of your hips. The pain may be felt on the inside of the hip joint near the groin, or on the outside near the buttocks and upper thigh. You may also have swelling or stiffness in your hip area. Follow these instructions at home: Managing pain, stiffness, and swelling  If directed, put ice on the painful area. To do this: ? Put ice in a plastic bag. ? Place a towel between your skin and the bag. ? Leave the ice on for 20 minutes, 2-3 times a day.  If directed, apply heat to the affected area as often as told by your health care provider. Use the heat source that your health care provider recommends, such as a moist heat pack or a heating pad. ? Place a towel between your skin and the heat source. ? Leave the heat on for 20-30 minutes. ? Remove the heat if your skin turns bright red. This is especially important if you are unable to feel pain, heat, or cold. You may have a greater risk of getting burned.      Activity  Do exercises as told by your health care provider.  Avoid activities that cause pain. General instructions  Take over-the-counter and prescription medicines only as told by your health care provider.  Keep a journal of your symptoms. Write down: ? How often you have hip pain. ? The location of your pain. ? What the pain feels like. ? What makes the pain worse.  Sleep with a pillow between your legs on your most comfortable side.  Keep all follow-up visits as told by your health care provider. This is important.   Contact a health care provider if:  You cannot put weight on your leg.  Your pain or swelling continues or gets worse after one week.  It gets harder to walk.  You have a fever. Get help right away  if:  You fall.  You have a sudden increase in pain and swelling in your hip.  Your hip is red or swollen or very tender to touch. Summary  Hip pain can range from a minor ache to severe pain in one or both of your hips.  The pain may be felt on the inside of the hip joint near the groin, or on the outside near the buttocks and upper thigh.  Avoid activities that cause pain.  Write down how often you have hip pain, the location of the pain, what makes it worse, and what it feels like. This information is not intended to replace advice given to you by your health care provider. Make sure you discuss any questions you have with your health care provider. Document Revised: 05/03/2019 Document Reviewed: 05/03/2019 Elsevier Patient Education  2021 Elsevier Inc.  
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
## 2021-04-10 ENCOUNTER — Encounter: Payer: Medicaid Other | Admitting: Obstetrics and Gynecology

## 2021-04-10 LAB — GC/CHLAMYDIA PROBE AMP (~~LOC~~) NOT AT ARMC
Chlamydia: NEGATIVE
Comment: NEGATIVE
Comment: NORMAL
Neisseria Gonorrhea: NEGATIVE

## 2021-04-11 LAB — CULTURE, OB URINE

## 2021-04-12 ENCOUNTER — Ambulatory Visit (INDEPENDENT_AMBULATORY_CARE_PROVIDER_SITE_OTHER): Payer: Medicaid Other | Admitting: Advanced Practice Midwife

## 2021-04-12 ENCOUNTER — Other Ambulatory Visit: Payer: Self-pay

## 2021-04-12 ENCOUNTER — Other Ambulatory Visit: Payer: Self-pay | Admitting: Certified Nurse Midwife

## 2021-04-12 VITALS — BP 132/69 | HR 104 | Wt 200.0 lb

## 2021-04-12 DIAGNOSIS — Z3480 Encounter for supervision of other normal pregnancy, unspecified trimester: Secondary | ICD-10-CM

## 2021-04-12 DIAGNOSIS — R102 Pelvic and perineal pain: Secondary | ICD-10-CM

## 2021-04-12 DIAGNOSIS — A6000 Herpesviral infection of urogenital system, unspecified: Secondary | ICD-10-CM | POA: Insufficient documentation

## 2021-04-12 DIAGNOSIS — M25552 Pain in left hip: Secondary | ICD-10-CM

## 2021-04-12 DIAGNOSIS — Z3A25 25 weeks gestation of pregnancy: Secondary | ICD-10-CM

## 2021-04-12 DIAGNOSIS — O26892 Other specified pregnancy related conditions, second trimester: Secondary | ICD-10-CM | POA: Insufficient documentation

## 2021-04-12 LAB — HSV CULTURE AND TYPING

## 2021-04-12 MED ORDER — OXYCODONE HCL 5 MG PO TABS: 5.0000 mg | ORAL_TABLET | Freq: Four times a day (QID) | ORAL | 0 refills | Status: AC | PRN

## 2021-04-12 MED ORDER — CYCLOBENZAPRINE HCL 10 MG PO TABS: 10.0000 mg | ORAL_TABLET | Freq: Two times a day (BID) | ORAL | 0 refills | Status: DC | PRN

## 2021-04-12 MED ORDER — VALACYCLOVIR HCL 1 G PO TABS: 1000.0000 mg | ORAL_TABLET | Freq: Two times a day (BID) | ORAL | 0 refills | Status: AC

## 2021-04-12 MED ORDER — DEXAMETHASONE 6 MG PO TABS: 6.0000 mg | ORAL_TABLET | Freq: Every day | ORAL | 0 refills | Status: DC

## 2021-04-12 NOTE — Progress Notes (Signed)
   PRENATAL VISIT NOTE  Subjective:  Kristi Roth is a 25 y.o. G2P1001 at [redacted]w[redacted]d being seen today for ongoing prenatal care.  She is currently monitored for the following issues for this low-risk pregnancy and has GBS bacteriuria; Vaginal bleeding in pregnancy, third trimester; Abruptio placenta, third trimester; Supervision of other normal pregnancy, antepartum; Primary genital herpes simplex infection; and Pelvic pain affecting pregnancy in second trimester, antepartum on their problem list.  Patient reports pelvic/hip pain, vaginal lesion tested in [facility] is almost healed.  Contractions: Irritability. Vag. Bleeding: None.  Movement: Present. Denies leaking of fluid.   The following portions of the patient's history were reviewed and updated as appropriate: allergies, current medications, past family history, past medical history, past social history, past surgical history and problem list.   Objective:   Vitals:   04/12/21 1518  BP: 132/69  Pulse: (!) 104  Weight: 200 lb (90.7 kg)    Fetal Status: Fetal Heart Rate (bpm): 142   Movement: Present     General:  Alert, oriented and cooperative. Patient is in no acute distress.  Skin: Skin is warm and dry. No rash noted.   Cardiovascular: Normal heart rate noted  Respiratory: Normal respiratory effort, no problems with respiration noted  Abdomen: Soft, gravid, appropriate for gestational age.  Pain/Pressure: Absent     Pelvic: Cervical exam deferred        Extremities: Normal range of motion.  Edema: Trace  Mental Status: Normal mood and affect. Normal behavior. Normal judgment and thought content.   Assessment and Plan:  Pregnancy: G2P1001 at [redacted]w[redacted]d 1. Supervision of other normal pregnancy, antepartum --Anticipatory guidance about next visits/weeks of pregnancy given. --Next visit in 3 weeks for GTT  2. [redacted] weeks gestation of pregnancy   3. Primary genital herpes simplex infection --No prior outbreak. This was single small lesion  that is almost fully healed.  --Pt desires to take medication so primary outbreak dose of Valtrex sent to pharmacy --Discussed HSV, questions answered. - valACYclovir (VALTREX) 1000 MG tablet; Take 1 tablet (1,000 mg total) by mouth 2 (two) times daily for 7 days.  Dispense: 14 tablet; Refill: 0  4. Pelvic pain affecting pregnancy in second trimester, antepartum --Rest/ice/heat/warm bath/Tylenol/pregnancy support belt --Discussed pregnancy support belt as addition to medications prescribed (Flexeril and oxycodone for short course) - Ambulatory referral to Physical Therapy  Preterm labor symptoms and general obstetric precautions including but not limited to vaginal bleeding, contractions, leaking of fluid and fetal movement were reviewed in detail with the patient. Please refer to After Visit Summary for other counseling recommendations.   Return in about 3 weeks (around 05/03/2021).  Future Appointments  Date Time Provider Department Center  05/03/2021  9:00 AM [facility] None  05/03/2021  9:35 AM Burleson, Brand Males, NP [facility] None    Sharen Counter, CNM

## 2021-04-12 NOTE — Progress Notes (Signed)
ROB [redacted]w[redacted]d  Needs PNV' s resent   CC: None

## 2021-04-12 NOTE — Progress Notes (Signed)
Pain meds prescribed for acute hip/back pain  Edd Arbour, CNM, MSN, IBCLC Certified Nurse Midwife, [facility]

## 2021-04-26 ENCOUNTER — Ambulatory Visit: Payer: Medicaid Other | Admitting: Physical Therapy

## 2021-05-02 ENCOUNTER — Encounter: Payer: Self-pay | Admitting: Nurse Practitioner

## 2021-05-03 ENCOUNTER — Encounter: Payer: Medicaid Other | Admitting: Nurse Practitioner

## 2021-05-03 ENCOUNTER — Other Ambulatory Visit: Payer: Medicaid Other

## 2021-05-09 ENCOUNTER — Ambulatory Visit: Payer: Medicaid Other | Attending: Advanced Practice Midwife | Admitting: Physical Therapy

## 2021-05-16 ENCOUNTER — Ambulatory Visit (INDEPENDENT_AMBULATORY_CARE_PROVIDER_SITE_OTHER): Payer: Medicaid Other | Admitting: Obstetrics and Gynecology

## 2021-05-16 ENCOUNTER — Other Ambulatory Visit: Payer: Self-pay

## 2021-05-16 ENCOUNTER — Encounter: Payer: Self-pay | Admitting: Obstetrics and Gynecology

## 2021-05-16 VITALS — BP 102/62 | HR 108 | Wt 197.0 lb

## 2021-05-16 DIAGNOSIS — A6000 Herpesviral infection of urogenital system, unspecified: Secondary | ICD-10-CM

## 2021-05-16 DIAGNOSIS — Z3480 Encounter for supervision of other normal pregnancy, unspecified trimester: Secondary | ICD-10-CM

## 2021-05-16 NOTE — Progress Notes (Signed)
   PRENATAL VISIT NOTE  Subjective:  Kristi Roth is a 25 y.o. G2P1001 at [redacted]w[redacted]d being seen today for ongoing prenatal care.  She is currently monitored for the following issues for this low-risk pregnancy and has History of placenta abruption; Supervision of other normal pregnancy, antepartum; Primary genital herpes simplex infection; and Pelvic pain affecting pregnancy in second trimester, antepartum on their problem list.  Patient reports no complaints.  Contractions: Not present. Vag. Bleeding: None.  Movement: Present. Denies leaking of fluid.   The following portions of the patient's history were reviewed and updated as appropriate: allergies, current medications, past family history, past medical history, past social history, past surgical history and problem list.   Objective:   Vitals:   05/16/21 1405  BP: 102/62  Pulse: (!) 108  Weight: 197 lb (89.4 kg)    Fetal Status: Fetal Heart Rate (bpm): 135 Fundal Height: 30 cm Movement: Present     General:  Alert, oriented and cooperative. Patient is in no acute distress.  Skin: Skin is warm and dry. No rash noted.   Cardiovascular: Normal heart rate noted  Respiratory: Normal respiratory effort, no problems with respiration noted  Abdomen: Soft, gravid, appropriate for gestational age.  Pain/Pressure: Absent     Pelvic: Cervical exam deferred        Extremities: Normal range of motion.  Edema: Trace  Mental Status: Normal mood and affect. Normal behavior. Normal judgment and thought content.   Assessment and Plan:  Pregnancy: G2P1001 at [redacted]w[redacted]d 1. Supervision of other normal pregnancy, antepartum Patient is doing well 1 hour glucola today as patient is not able to come earlier in the morning Follow up ultrasound ordered Patient desires patch for contraception  2. Primary genital herpes simplex infection Prophylaxis at 36 weeks  Preterm labor symptoms and general obstetric precautions including but not limited to vaginal  bleeding, contractions, leaking of fluid and fetal movement were reviewed in detail with the patient. Please refer to After Visit Summary for other counseling recommendations.   Return in about 2 weeks (around 05/30/2021) for in person, ROB, High risk, Virtual.  Future Appointments  Date Time Provider Department Center  05/30/2021  2:15 PM Venora Maples, MD [facility] None    Catalina Antigua, MD

## 2021-05-16 NOTE — Progress Notes (Signed)
+   Fetal movement. Pt c/o decreased appetite. States she has not been able to eat, however she has does not c/o nausea or vomiting.

## 2021-05-17 LAB — HIV ANTIBODY (ROUTINE TESTING W REFLEX): HIV Screen 4th Generation wRfx: NONREACTIVE

## 2021-05-17 LAB — CBC
Hematocrit: 33.6 % — ABNORMAL LOW (ref 34.0–46.6)
Hemoglobin: 10.6 g/dL — ABNORMAL LOW (ref 11.1–15.9)
MCH: 27.8 pg (ref 26.6–33.0)
MCHC: 31.5 g/dL (ref 31.5–35.7)
MCV: 88 fL (ref 79–97)
Platelets: 248 10*3/uL (ref 150–450)
RBC: 3.81 x10E6/uL (ref 3.77–5.28)
RDW: 11.9 % (ref 11.7–15.4)
WBC: 8.6 10*3/uL (ref 3.4–10.8)

## 2021-05-17 LAB — GLUCOSE TOLERANCE, 1 HOUR: Glucose, 1Hr PP: 141 mg/dL (ref 65–199)

## 2021-05-17 LAB — RPR: RPR Ser Ql: NONREACTIVE

## 2021-05-21 ENCOUNTER — Ambulatory Visit (INDEPENDENT_AMBULATORY_CARE_PROVIDER_SITE_OTHER): Payer: Medicaid Other | Admitting: Licensed Clinical Social Worker

## 2021-05-21 DIAGNOSIS — Z91199 Patient's noncompliance with other medical treatment and regimen due to unspecified reason: Secondary | ICD-10-CM

## 2021-05-21 DIAGNOSIS — Z5329 Procedure and treatment not carried out because of patient's decision for other reasons: Secondary | ICD-10-CM

## 2021-05-23 NOTE — BH Specialist Note (Signed)
Pt denied visit.

## 2021-05-30 ENCOUNTER — Telehealth (INDEPENDENT_AMBULATORY_CARE_PROVIDER_SITE_OTHER): Payer: Medicaid Other | Admitting: Family Medicine

## 2021-05-30 ENCOUNTER — Encounter: Payer: Self-pay | Admitting: Family Medicine

## 2021-05-30 DIAGNOSIS — O09293 Supervision of pregnancy with other poor reproductive or obstetric history, third trimester: Secondary | ICD-10-CM

## 2021-05-30 DIAGNOSIS — Z3480 Encounter for supervision of other normal pregnancy, unspecified trimester: Secondary | ICD-10-CM

## 2021-05-30 DIAGNOSIS — Z8759 Personal history of other complications of pregnancy, childbirth and the puerperium: Secondary | ICD-10-CM

## 2021-05-30 DIAGNOSIS — Z3A32 32 weeks gestation of pregnancy: Secondary | ICD-10-CM

## 2021-05-30 DIAGNOSIS — O98313 Other infections with a predominantly sexual mode of transmission complicating pregnancy, third trimester: Secondary | ICD-10-CM

## 2021-05-30 DIAGNOSIS — A6000 Herpesviral infection of urogenital system, unspecified: Secondary | ICD-10-CM

## 2021-05-30 NOTE — Patient Instructions (Signed)
 Contraception Choices Contraception, also called birth control, refers to methods or devices that prevent pregnancy. Hormonal methods Contraceptive implant A contraceptive implant is a thin, plastic tube that contains a hormone that prevents pregnancy. It is different from an intrauterine device (IUD). It is inserted into the upper part of the arm by a health care provider. Implants can be effective for up to 3 years. Progestin-only injections Progestin-only injections are injections of progestin, a synthetic form of the hormone progesterone. They are given every 3 months by a health care provider. Birth control pills Birth control pills are pills that contain hormones that prevent pregnancy. They must be taken once a day, preferably at the same time each day. A prescription is needed to use this method of contraception. Birth control patch The birth control patch contains hormones that prevent pregnancy. It is placed on the skin and must be changed once a week for three weeks and removed on the fourth week. A prescription is needed to use this method of contraception. Vaginal ring A vaginal ring contains hormones that prevent pregnancy. It is placed in the vagina for three weeks and removed on the fourth week. After that, the process is repeated with a new ring. A prescription is needed to use this method of contraception. Emergency contraceptive Emergency contraceptives prevent pregnancy after unprotected sex. They come in pill form and can be taken up to 5 days after sex. They work best the sooner they are taken after having sex. Most emergency contraceptives are available without a prescription. This method should not be used as your only form of birth control.   Barrier methods Female condom A female condom is a thin sheath that is worn over the penis during sex. Condoms keep sperm from going inside a woman's body. They can be used with a sperm-killing substance (spermicide) to increase their  effectiveness. They should be thrown away after one use. Female condom A female condom is a soft, loose-fitting sheath that is put into the vagina before sex. The condom keeps sperm from going inside a woman's body. They should be thrown away after one use. Diaphragm A diaphragm is a soft, dome-shaped barrier. It is inserted into the vagina before sex, along with a spermicide. The diaphragm blocks sperm from entering the uterus, and the spermicide kills sperm. A diaphragm should be left in the vagina for 6-8 hours after sex and removed within 24 hours. A diaphragm is prescribed and fitted by a health care provider. A diaphragm should be replaced every 1-2 years, after giving birth, after gaining more than 15 lb (6.8 kg), and after pelvic surgery. Cervical cap A cervical cap is a round, soft latex or plastic cup that fits over the cervix. It is inserted into the vagina before sex, along with spermicide. It blocks sperm from entering the uterus. The cap should be left in place for 6-8 hours after sex and removed within 48 hours. A cervical cap must be prescribed and fitted by a health care provider. It should be replaced every 2 years. Sponge A sponge is a soft, circular piece of polyurethane foam with spermicide in it. The sponge helps block sperm from entering the uterus, and the spermicide kills sperm. To use it, you make it wet and then insert it into the vagina. It should be inserted before sex, left in for at least 6 hours after sex, and removed and thrown away within 30 hours. Spermicides Spermicides are chemicals that kill or block sperm from entering the   cervix and uterus. They can come as a cream, jelly, suppository, foam, or tablet. A spermicide should be inserted into the vagina with an applicator at least 10-15 minutes before sex to allow time for it to work. The process must be repeated every time you have sex. Spermicides do not require a prescription.   Intrauterine  contraception Intrauterine device (IUD) An IUD is a T-shaped device that is put in a woman's uterus. There are two types:  Hormone IUD.This type contains progestin, a synthetic form of the hormone progesterone. This type can stay in place for 3-5 years.  Copper IUD.This type is wrapped in copper wire. It can stay in place for 10 years. Permanent methods of contraception Female tubal ligation In this method, a woman's fallopian tubes are sealed, tied, or blocked during surgery to prevent eggs from traveling to the uterus. Hysteroscopic sterilization In this method, a small, flexible insert is placed into each fallopian tube. The inserts cause scar tissue to form in the fallopian tubes and block them, so sperm cannot reach an egg. The procedure takes about 3 months to be effective. Another form of birth control must be used during those 3 months. Female sterilization This is a procedure to tie off the tubes that carry sperm (vasectomy). After the procedure, the man can still ejaculate fluid (semen). Another form of birth control must be used for 3 months after the procedure. Natural planning methods Natural family planning In this method, a couple does not have sex on days when the woman could become pregnant. Calendar method In this method, the woman keeps track of the length of each menstrual cycle, identifies the days when pregnancy can happen, and does not have sex on those days. Ovulation method In this method, a couple avoids sex during ovulation. Symptothermal method This method involves not having sex during ovulation. The woman typically checks for ovulation by watching changes in her temperature and in the consistency of cervical mucus. Post-ovulation method In this method, a couple waits to have sex until after ovulation. Where to find more information  Centers for Disease Control and Prevention: www.cdc.gov Summary  Contraception, also called birth control, refers to methods or  devices that prevent pregnancy.  Hormonal methods of contraception include implants, injections, pills, patches, vaginal rings, and emergency contraceptives.  Barrier methods of contraception can include female condoms, female condoms, diaphragms, cervical caps, sponges, and spermicides.  There are two types of IUDs (intrauterine devices). An IUD can be put in a woman's uterus to prevent pregnancy for 3-5 years.  Permanent sterilization can be done through a procedure for males and females. Natural family planning methods involve nothaving sex on days when the woman could become pregnant. This information is not intended to replace advice given to you by your health care provider. Make sure you discuss any questions you have with your health care provider. Document Revised: 05/22/2020 Document Reviewed: 05/22/2020 Elsevier Patient Education  2021 Elsevier Inc.   Breastfeeding  Choosing to breastfeed is one of the best decisions you can make for yourself and your baby. A change in hormones during pregnancy causes your breasts to make breast milk in your milk-producing glands. Hormones prevent breast milk from being released before your baby is born. They also prompt milk flow after birth. Once breastfeeding has begun, thoughts of your baby, as well as his or her sucking or crying, can stimulate the release of milk from your milk-producing glands. Benefits of breastfeeding Research shows that breastfeeding offers many health benefits   for infants and mothers. It also offers a cost-free and convenient way to feed your baby. For your baby  Your first milk (colostrum) helps your baby's digestive system to function better.  Special cells in your milk (antibodies) help your baby to fight off infections.  Breastfed babies are less likely to develop asthma, allergies, obesity, or type 2 diabetes. They are also at lower risk for sudden infant death syndrome (SIDS).  Nutrients in breast milk are better  able to meet your baby's needs compared to infant formula.  Breast milk improves your baby's brain development. For you  Breastfeeding helps to create a very special bond between you and your baby.  Breastfeeding is convenient. Breast milk costs nothing and is always available at the correct temperature.  Breastfeeding helps to burn calories. It helps you to lose the weight that you gained during pregnancy.  Breastfeeding makes your uterus return faster to its size before pregnancy. It also slows bleeding (lochia) after you give birth.  Breastfeeding helps to lower your risk of developing type 2 diabetes, osteoporosis, rheumatoid arthritis, cardiovascular disease, and breast, ovarian, uterine, and endometrial cancer later in life. Breastfeeding basics Starting breastfeeding  Find a comfortable place to sit or lie down, with your neck and back well-supported.  Place a pillow or a rolled-up blanket under your baby to bring him or her to the level of your breast (if you are seated). Nursing pillows are specially designed to help support your arms and your baby while you breastfeed.  Make sure that your baby's tummy (abdomen) is facing your abdomen.  Gently massage your breast. With your fingertips, massage from the outer edges of your breast inward toward the nipple. This encourages milk flow. If your milk flows slowly, you may need to continue this action during the feeding.  Support your breast with 4 fingers underneath and your thumb above your nipple (make the letter "C" with your hand). Make sure your fingers are well away from your nipple and your baby's mouth.  Stroke your baby's lips gently with your finger or nipple.  When your baby's mouth is open wide enough, quickly bring your baby to your breast, placing your entire nipple and as much of the areola as possible into your baby's mouth. The areola is the colored area around your nipple. ? More areola should be visible above your  baby's upper lip than below the lower lip. ? Your baby's lips should be opened and extended outward (flanged) to ensure an adequate, comfortable latch. ? Your baby's tongue should be between his or her lower gum and your breast.  Make sure that your baby's mouth is correctly positioned around your nipple (latched). Your baby's lips should create a seal on your breast and be turned out (everted).  It is common for your baby to suck about 2-3 minutes in order to start the flow of breast milk. Latching Teaching your baby how to latch onto your breast properly is very important. An improper latch can cause nipple pain, decreased milk supply, and poor weight gain in your baby. Also, if your baby is not latched onto your nipple properly, he or she may swallow some air during feeding. This can make your baby fussy. Burping your baby when you switch breasts during the feeding can help to get rid of the air. However, teaching your baby to latch on properly is still the best way to prevent fussiness from swallowing air while breastfeeding. Signs that your baby has successfully latched onto   your nipple  Silent tugging or silent sucking, without causing you pain. Infant's lips should be extended outward (flanged).  Swallowing heard between every 3-4 sucks once your milk has started to flow (after your let-down milk reflex occurs).  Muscle movement above and in front of his or her ears while sucking. Signs that your baby has not successfully latched onto your nipple  Sucking sounds or smacking sounds from your baby while breastfeeding.  Nipple pain. If you think your baby has not latched on correctly, slip your finger into the corner of your baby's mouth to break the suction and place it between your baby's gums. Attempt to start breastfeeding again. Signs of successful breastfeeding Signs from your baby  Your baby will gradually decrease the number of sucks or will completely stop sucking.  Your baby  will fall asleep.  Your baby's body will relax.  Your baby will retain a small amount of milk in his or her mouth.  Your baby will let go of your breast by himself or herself. Signs from you  Breasts that have increased in firmness, weight, and size 1-3 hours after feeding.  Breasts that are softer immediately after breastfeeding.  Increased milk volume, as well as a change in milk consistency and color by the fifth day of breastfeeding.  Nipples that are not sore, cracked, or bleeding. Signs that your baby is getting enough milk  Wetting at least 1-2 diapers during the first 24 hours after birth.  Wetting at least 5-6 diapers every 24 hours for the first week after birth. The urine should be clear or pale yellow by the age of 5 days.  Wetting 6-8 diapers every 24 hours as your baby continues to grow and develop.  At least 3 stools in a 24-hour period by the age of 5 days. The stool should be soft and yellow.  At least 3 stools in a 24-hour period by the age of 7 days. The stool should be seedy and yellow.  No loss of weight greater than 10% of birth weight during the first 3 days of life.  Average weight gain of 4-7 oz (113-198 g) per week after the age of 4 days.  Consistent daily weight gain by the age of 5 days, without weight loss after the age of 2 weeks. After a feeding, your baby may spit up a small amount of milk. This is normal. Breastfeeding frequency and duration Frequent feeding will help you make more milk and can prevent sore nipples and extremely full breasts (breast engorgement). Breastfeed when you feel the need to reduce the fullness of your breasts or when your baby shows signs of hunger. This is called "breastfeeding on demand." Signs that your baby is hungry include:  Increased alertness, activity, or restlessness.  Movement of the head from side to side.  Opening of the mouth when the corner of the mouth or cheek is stroked (rooting).  Increased  sucking sounds, smacking lips, cooing, sighing, or squeaking.  Hand-to-mouth movements and sucking on fingers or hands.  Fussing or crying. Avoid introducing a pacifier to your baby in the first 4-6 weeks after your baby is born. After this time, you may choose to use a pacifier. Research has shown that pacifier use during the first year of a baby's life decreases the risk of sudden infant death syndrome (SIDS). Allow your baby to feed on each breast as long as he or she wants. When your baby unlatches or falls asleep while feeding from the   first breast, offer the second breast. Because newborns are often sleepy in the first few weeks of life, you may need to awaken your baby to get him or her to feed. Breastfeeding times will vary from baby to baby. However, the following rules can serve as a guide to help you make sure that your baby is properly fed:  Newborns (babies 4 weeks of age or younger) may breastfeed every 1-3 hours.  Newborns should not go without breastfeeding for longer than 3 hours during the day or 5 hours during the night.  You should breastfeed your baby a minimum of 8 times in a 24-hour period. Breast milk pumping Pumping and storing breast milk allows you to make sure that your baby is exclusively fed your breast milk, even at times when you are unable to breastfeed. This is especially important if you go back to work while you are still breastfeeding, or if you are not able to be present during feedings. Your lactation consultant can help you find a method of pumping that works best for you and give you guidelines about how long it is safe to store breast milk.      Caring for your breasts while you breastfeed Nipples can become dry, cracked, and sore while breastfeeding. The following recommendations can help keep your breasts moisturized and healthy:  Avoid using soap on your nipples.  Wear a supportive bra designed especially for nursing. Avoid wearing underwire-style  bras or extremely tight bras (sports bras).  Air-dry your nipples for 3-4 minutes after each feeding.  Use only cotton bra pads to absorb leaked breast milk. Leaking of breast milk between feedings is normal.  Use lanolin on your nipples after breastfeeding. Lanolin helps to maintain your skin's normal moisture barrier. Pure lanolin is not harmful (not toxic) to your baby. You may also hand express a few drops of breast milk and gently massage that milk into your nipples and allow the milk to air-dry. In the first few weeks after giving birth, some women experience breast engorgement. Engorgement can make your breasts feel heavy, warm, and tender to the touch. Engorgement peaks within 3-5 days after you give birth. The following recommendations can help to ease engorgement:  Completely empty your breasts while breastfeeding or pumping. You may want to start by applying warm, moist heat (in the shower or with warm, water-soaked hand towels) just before feeding or pumping. This increases circulation and helps the milk flow. If your baby does not completely empty your breasts while breastfeeding, pump any extra milk after he or she is finished.  Apply ice packs to your breasts immediately after breastfeeding or pumping, unless this is too uncomfortable for you. To do this: ? Put ice in a plastic bag. ? Place a towel between your skin and the bag. ? Leave the ice on for 20 minutes, 2-3 times a day.  Make sure that your baby is latched on and positioned properly while breastfeeding. If engorgement persists after 48 hours of following these recommendations, contact your health care provider or a lactation consultant. Overall health care recommendations while breastfeeding  Eat 3 healthy meals and 3 snacks every day. Well-nourished mothers who are breastfeeding need an additional 450-500 calories a day. You can meet this requirement by increasing the amount of a balanced diet that you eat.  Drink  enough water to keep your urine pale yellow or clear.  Rest often, relax, and continue to take your prenatal vitamins to prevent fatigue, stress, and low   vitamin and mineral levels in your body (nutrient deficiencies).  Do not use any products that contain nicotine or tobacco, such as cigarettes and e-cigarettes. Your baby may be harmed by chemicals from cigarettes that pass into breast milk and exposure to secondhand smoke. If you need help quitting, ask your health care provider.  Avoid alcohol.  Do not use illegal drugs or marijuana.  Talk with your health care provider before taking any medicines. These include over-the-counter and prescription medicines as well as vitamins and herbal supplements. Some medicines that may be harmful to your baby can pass through breast milk.  It is possible to become pregnant while breastfeeding. If birth control is desired, ask your health care provider about options that will be safe while breastfeeding your baby. Where to find more information: [employer]: www.llli.org Contact a health care provider if:  You feel like you want to stop breastfeeding or have become frustrated with breastfeeding.  Your nipples are cracked or bleeding.  Your breasts are red, tender, or warm.  You have: ? Painful breasts or nipples. ? A swollen area on either breast. ? A fever or chills. ? Nausea or vomiting. ? Drainage other than breast milk from your nipples.  Your breasts do not become full before feedings by the fifth day after you give birth.  You feel sad and depressed.  Your baby is: ? Too sleepy to eat well. ? Having trouble sleeping. ? More than 1 week old and wetting fewer than 6 diapers in a 24-hour period. ? Not gaining weight by 5 days of age.  Your baby has fewer than 3 stools in a 24-hour period.  Your baby's skin or the white parts of his or her eyes become yellow. Get help right away if:  Your baby is overly tired  (lethargic) and does not want to wake up and feed.  Your baby develops an unexplained fever. Summary  Breastfeeding offers many health benefits for infant and mothers.  Try to breastfeed your infant when he or she shows early signs of hunger.  Gently tickle or stroke your baby's lips with your finger or nipple to allow the baby to open his or her mouth. Bring the baby to your breast. Make sure that much of the areola is in your baby's mouth. Offer one side and burp the baby before you offer the other side.  Talk with your health care provider or lactation consultant if you have questions or you face problems as you breastfeed. This information is not intended to replace advice given to you by your health care provider. Make sure you discuss any questions you have with your health care provider. Document Revised: 03/12/2018 Document Reviewed: 01/17/2017 Elsevier Patient Education  2021 Elsevier Inc.  

## 2021-05-30 NOTE — Progress Notes (Signed)
I connected with Kristi Roth 05/30/21 at  2:15 PM EDT by: MyChart video and verified that I am speaking with the correct person using two identifiers.  Patient is located at home and provider is located at [name].     The purpose of this virtual visit is to provide medical care while limiting exposure to the novel coronavirus. I discussed the limitations, risks, security and privacy concerns of performing an evaluation and management service by MyChart video and the availability of in person appointments. I also discussed with the patient that there may be a patient responsible charge related to this service. By engaging in this virtual visit, you consent to the provision of healthcare.  Additionally, you authorize for your insurance to be billed for the services provided during this visit.  The patient expressed understanding and agreed to proceed.  The following staff members participated in the virtual visit:  Venora Maples, MD/MPH Attending Family Medicine Physician, Faculty [facility] for [facility], [facility]     PRENATAL VISIT NOTE  Subjective:  Kristi Roth is a 25 y.o. G2P1001 at [redacted]w[redacted]d  for phone visit for ongoing prenatal care.  She is currently monitored for the following issues for this low-risk pregnancy and has History of placenta abruption; Supervision of other normal pregnancy, antepartum; Primary genital herpes simplex infection; and Pelvic pain affecting pregnancy in second trimester, antepartum on their problem list.  Patient reports no complaints.  Contractions: Irritability. Vag. Bleeding: None.  Movement: Present. Denies leaking of fluid.   The following portions of the patient's history were reviewed and updated as appropriate: allergies, current medications, past family history, past medical history, past social history, past surgical history and problem list.   Objective:  There were no vitals filed for this visit.  Self-Obtained  Fetal Status:     Movement: Present     Assessment and Plan:  Pregnancy: G2P1001 at [redacted]w[redacted]d 1. Supervision of other normal pregnancy, antepartum Virtual visit Reports good fetal movement Desires tubal ligation Counseled extensively on permanence of procedure, availability of equally effective LARCs Remains firm in her decision Asked her to come to [place] office ASAP to sign papers Patient desires permanent sterilization.  Other reversible forms of contraception were discussed with patient; she declines all other modalities. Risks of procedure discussed with patient including but not limited to: risk of regret, permanence of method, bleeding, infection, injury to surrounding organs and need for additional procedures.  Failure risk of about 1% with increased risk of ectopic gestation if pregnancy occurs was also discussed with patient.  Also discussed possibility of post-tubal pain syndrome.   2. Primary genital herpes simplex infection Start prophylaxis closer to delivery  3. History of placenta abruption With last pregnancy  4. History of 3B perineal laceration   Preterm labor symptoms and general obstetric precautions including but not limited to vaginal bleeding, contractions, leaking of fluid and fetal movement were reviewed in detail with the patient.  Return in 2 weeks (on 06/13/2021) for ob visit.  Future Appointments  Date Time Provider Department Center  06/05/2021  3:30 PM [facility] NURSE [place] [facility]  06/05/2021  3:45 PM [facility] US[facility]     Time spent on virtual visit: 15 minutes  Venora Maples, MD

## 2021-05-30 NOTE — Progress Notes (Signed)
Virtual ROB  Pt noted spotting 2 days ago when wiping pt noted recent intercourse as well. No bleeding today.  Increased pelvic pain. No appetite to eat. Pt not able to check B/P at this time.

## 2021-06-05 ENCOUNTER — Inpatient Hospital Stay (HOSPITAL_COMMUNITY)
Admission: AD | Admit: 2021-06-05 | Discharge: 2021-06-05 | Disposition: A | Discharge status: Home / Self Care | Payer: Medicaid Other | Attending: Obstetrics and Gynecology | Admitting: Obstetrics and Gynecology

## 2021-06-05 ENCOUNTER — Ambulatory Visit: Payer: Medicaid Other | Attending: Obstetrics and Gynecology

## 2021-06-05 ENCOUNTER — Encounter (HOSPITAL_COMMUNITY): Payer: Self-pay | Admitting: Obstetrics and Gynecology

## 2021-06-05 ENCOUNTER — Encounter: Payer: Self-pay | Admitting: *Deleted

## 2021-06-05 ENCOUNTER — Ambulatory Visit: Payer: Medicaid Other | Admitting: *Deleted

## 2021-06-05 ENCOUNTER — Other Ambulatory Visit: Payer: Self-pay

## 2021-06-05 DIAGNOSIS — Z3480 Encounter for supervision of other normal pregnancy, unspecified trimester: Secondary | ICD-10-CM | POA: Diagnosis not present

## 2021-06-05 DIAGNOSIS — Z3A33 33 weeks gestation of pregnancy: Secondary | ICD-10-CM

## 2021-06-05 DIAGNOSIS — N898 Other specified noninflammatory disorders of vagina: Secondary | ICD-10-CM

## 2021-06-05 DIAGNOSIS — O26893 Other specified pregnancy related conditions, third trimester: Secondary | ICD-10-CM

## 2021-06-05 LAB — WET PREP, GENITAL
Clue Cells Wet Prep HPF POC: NONE SEEN
Sperm: NONE SEEN
Trich, Wet Prep: NONE SEEN
Yeast Wet Prep HPF POC: NONE SEEN

## 2021-06-05 NOTE — Progress Notes (Signed)
C/O" gush of fluid 2 days ago, continues to have trickling, soaking a panty liner-clear fluid. Notifying OB now through my-chart."
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
## 2021-06-05 NOTE — MAU Note (Signed)
Sent here from MFM. ? Leakage, been going on for 2 days.  Now is more like a heavy d/c. Having pain in ribs. Minor cramps.  Feeling nauseous and light headed.

## 2021-06-05 NOTE — MAU Note (Signed)
Patient had ultrasound today and was sent by [name] to come to MAU for evaluation of low fluid. Patient also states that she had LOF this morning that ran down her leg after voiding. Denies VB. +FM "but not as much as usual".

## 2021-06-05 NOTE — Progress Notes (Signed)
Pt called OB while in waiting room and was told by MD to go directly to MAU to be assessed due to leakage of fluid. Pt left prior to MFM U/S being done.

## 2021-06-06 LAB — GC/CHLAMYDIA PROBE AMP (~~LOC~~) NOT AT ARMC
Chlamydia: NEGATIVE
Comment: NEGATIVE
Comment: NORMAL
Neisseria Gonorrhea: NEGATIVE

## 2021-06-13 ENCOUNTER — Ambulatory Visit (INDEPENDENT_AMBULATORY_CARE_PROVIDER_SITE_OTHER): Payer: Medicaid Other | Admitting: Obstetrics and Gynecology

## 2021-06-13 ENCOUNTER — Encounter: Payer: Self-pay | Admitting: Obstetrics and Gynecology

## 2021-06-13 ENCOUNTER — Other Ambulatory Visit: Payer: Self-pay

## 2021-06-13 VITALS — BP 114/63 | HR 115 | Wt 199.0 lb

## 2021-06-13 DIAGNOSIS — A6000 Herpesviral infection of urogenital system, unspecified: Secondary | ICD-10-CM

## 2021-06-13 DIAGNOSIS — Z3480 Encounter for supervision of other normal pregnancy, unspecified trimester: Secondary | ICD-10-CM

## 2021-06-13 DIAGNOSIS — Z3009 Encounter for other general counseling and advice on contraception: Secondary | ICD-10-CM | POA: Insufficient documentation

## 2021-06-13 NOTE — Patient Instructions (Signed)

## 2021-06-13 NOTE — Progress Notes (Signed)
Subjective:  Kristi Roth is a 25 y.o. G2P1001 at [redacted]w[redacted]d being seen today for ongoing prenatal care.  She is currently monitored for the following issues for this low-risk pregnancy and has History of placenta abruption; Supervision of other normal pregnancy, antepartum; Primary genital herpes simplex infection; Pelvic pain affecting pregnancy in second trimester, antepartum; Type 3b perineal laceration during delivery; and Unwanted fertility on their problem list.  Patient reports general discomforts of pregnancy.  Contractions: Irritability. Vag. Bleeding: None.  Movement: Present. Denies leaking of fluid.   The following portions of the patient's history were reviewed and updated as appropriate: allergies, current medications, past family history, past medical history, past social history, past surgical history and problem list. Problem list updated.  Objective:   Vitals:   06/13/21 1355  BP: 114/63  Pulse: (!) 115  Weight: 199 lb (90.3 kg)    Fetal Status: Fetal Heart Rate (bpm): 141   Movement: Present     General:  Alert, oriented and cooperative. Patient is in no acute distress.  Skin: Skin is warm and dry. No rash noted.   Cardiovascular: Normal heart rate noted  Respiratory: Normal respiratory effort, no problems with respiration noted  Abdomen: Soft, gravid, appropriate for gestational age. Pain/Pressure: Present     Pelvic:  Cervical exam deferred        Extremities: Normal range of motion.  Edema: Trace  Mental Status: Normal mood and affect. Normal behavior. Normal judgment and thought content.   Urinalysis:      Assessment and Plan:  Pregnancy: G2P1001 at [redacted]w[redacted]d  1. Supervision of other normal pregnancy, antepartum Stable - [place] MFM OB FOLLOW UP; Future  2. Primary genital herpes simplex infection Start Valtrex at next appt  3. Unwanted fertility BTL papers signed today See last OV note for counseling  Preterm labor symptoms and general obstetric precautions  including but not limited to vaginal bleeding, contractions, leaking of fluid and fetal movement were reviewed in detail with the patient. Please refer to After Visit Summary for other counseling recommendations.  Return in about 2 weeks (around 06/27/2021) for OB visit, face to face, any provider.   Hermina Staggers, MD

## 2021-06-21 ENCOUNTER — Ambulatory Visit: Payer: Medicaid Other | Attending: Obstetrics and Gynecology

## 2021-06-21 ENCOUNTER — Ambulatory Visit: Payer: Medicaid Other | Admitting: *Deleted

## 2021-06-21 ENCOUNTER — Other Ambulatory Visit: Payer: Self-pay

## 2021-06-21 ENCOUNTER — Encounter: Payer: Self-pay | Admitting: *Deleted

## 2021-06-21 VITALS — BP 114/62 | HR 106

## 2021-06-21 DIAGNOSIS — Z3480 Encounter for supervision of other normal pregnancy, unspecified trimester: Secondary | ICD-10-CM | POA: Insufficient documentation

## 2021-06-27 ENCOUNTER — Telehealth: Payer: Medicaid Other | Admitting: Women's Health

## 2021-06-27 DIAGNOSIS — A6000 Herpesviral infection of urogenital system, unspecified: Secondary | ICD-10-CM

## 2021-06-27 MED ORDER — VALACYCLOVIR HCL 500 MG PO TABS: 500.0000 mg | ORAL_TABLET | Freq: Two times a day (BID) | ORAL | 2 refills | Status: DC

## 2021-06-27 NOTE — Progress Notes (Addendum)
I connected with  Kristi Roth on 06/27/21 by a video enabled telemedicine application and verified that I am speaking with the correct person using two identifiers.   I discussed the limitations of evaluation and management by telemedicine. The patient expressed understanding and agreed to proceed.  PATIENT: HOME PROVIDER: [name]  MyChart OB c/o pain, pressure.  She misplaced her BP cuff.

## 2021-06-27 NOTE — Progress Notes (Signed)
Pt rescheduled, was advised by front desk next appt must be in-person for GBS/swabs.  Marylen Ponto, NP  4:08 PM 06/27/2021

## 2021-07-02 ENCOUNTER — Inpatient Hospital Stay (HOSPITAL_COMMUNITY)
Admission: AD | Admit: 2021-07-02 | Discharge: 2021-07-04 | DRG: 798 | Disposition: A | Discharge status: Home / Self Care | Payer: Medicaid Other | Attending: Obstetrics and Gynecology | Admitting: Obstetrics and Gynecology

## 2021-07-02 ENCOUNTER — Encounter (HOSPITAL_COMMUNITY): Payer: Self-pay | Admitting: Obstetrics and Gynecology

## 2021-07-02 ENCOUNTER — Encounter (HOSPITAL_COMMUNITY): Payer: Self-pay | Admitting: Family Medicine

## 2021-07-02 DIAGNOSIS — A6000 Herpesviral infection of urogenital system, unspecified: Secondary | ICD-10-CM | POA: Diagnosis present

## 2021-07-02 DIAGNOSIS — Z3A36 36 weeks gestation of pregnancy: Secondary | ICD-10-CM

## 2021-07-02 DIAGNOSIS — Z20822 Contact with and (suspected) exposure to covid-19: Secondary | ICD-10-CM | POA: Diagnosis present

## 2021-07-02 DIAGNOSIS — Z23 Encounter for immunization: Secondary | ICD-10-CM | POA: Diagnosis not present

## 2021-07-02 DIAGNOSIS — Z302 Encounter for sterilization: Secondary | ICD-10-CM

## 2021-07-02 DIAGNOSIS — O4202 Full-term premature rupture of membranes, onset of labor within 24 hours of rupture: Secondary | ICD-10-CM | POA: Diagnosis not present

## 2021-07-02 DIAGNOSIS — O26893 Other specified pregnancy related conditions, third trimester: Secondary | ICD-10-CM | POA: Diagnosis present

## 2021-07-02 DIAGNOSIS — Z3009 Encounter for other general counseling and advice on contraception: Secondary | ICD-10-CM | POA: Diagnosis present

## 2021-07-02 DIAGNOSIS — O9832 Other infections with a predominantly sexual mode of transmission complicating childbirth: Principal | ICD-10-CM | POA: Diagnosis present

## 2021-07-02 LAB — CBC
HCT: 32 % — ABNORMAL LOW (ref 36.0–46.0)
Hemoglobin: 10.4 g/dL — ABNORMAL LOW (ref 12.0–15.0)
MCH: 27.1 pg (ref 26.0–34.0)
MCHC: 32.5 g/dL (ref 30.0–36.0)
MCV: 83.3 fL (ref 80.0–100.0)
Platelets: 252 10*3/uL (ref 150–400)
RBC: 3.84 MIL/uL — ABNORMAL LOW (ref 3.87–5.11)
RDW: 12.2 % (ref 11.5–15.5)
WBC: 11 10*3/uL — ABNORMAL HIGH (ref 4.0–10.5)
nRBC: 0.3 % — ABNORMAL HIGH (ref 0.0–0.2)

## 2021-07-02 LAB — RESP PANEL BY RT-PCR (FLU A&B, COVID) ARPGX2
Influenza A by PCR: NEGATIVE
Influenza B by PCR: NEGATIVE
SARS Coronavirus 2 by RT PCR: NEGATIVE

## 2021-07-02 LAB — TYPE AND SCREEN
ABO/RH(D): AB POS
Antibody Screen: NEGATIVE

## 2021-07-02 LAB — RPR: RPR Ser Ql: NONREACTIVE

## 2021-07-02 MED ORDER — LACTATED RINGERS IV SOLN: INTRAVENOUS | Status: DC

## 2021-07-02 MED ORDER — ACETAMINOPHEN 325 MG PO TABS
650.0000 mg | ORAL_TABLET | ORAL | Status: DC | PRN
  Administered 2021-07-02: 650 mg via ORAL
  Filled 2021-07-02 (×2): qty 2

## 2021-07-02 MED ORDER — OXYTOCIN-SODIUM CHLORIDE 30-0.9 UT/500ML-% IV SOLN: 2.5000 [IU]/h | INTRAVENOUS | Status: DC

## 2021-07-02 MED ORDER — SIMETHICONE 80 MG PO CHEW: 80.0000 mg | CHEWABLE_TABLET | ORAL | Status: DC | PRN

## 2021-07-02 MED ORDER — OXYTOCIN BOLUS FROM INFUSION
333.0000 mL | Freq: Once | INTRAVENOUS | Status: DC
Start: 2021-07-02 — End: 2021-07-02

## 2021-07-02 MED ORDER — PRENATAL MULTIVITAMIN CH
1.0000 | ORAL_TABLET | Freq: Every day | ORAL | Status: DC
  Administered 2021-07-04: 1 via ORAL
  Filled 2021-07-02: qty 1

## 2021-07-02 MED ORDER — BENZOCAINE-MENTHOL 20-0.5 % EX AERO
1.0000 "application " | INHALATION_SPRAY | CUTANEOUS | Status: DC | PRN
  Administered 2021-07-02: 1 via TOPICAL
  Filled 2021-07-02: qty 56

## 2021-07-02 MED ORDER — OXYCODONE-ACETAMINOPHEN 5-325 MG PO TABS: 2.0000 | ORAL_TABLET | ORAL | Status: DC | PRN

## 2021-07-02 MED ORDER — DIBUCAINE (PERIANAL) 1 % EX OINT: 1.0000 "application " | TOPICAL_OINTMENT | CUTANEOUS | Status: DC | PRN

## 2021-07-02 MED ORDER — MEASLES, MUMPS & RUBELLA VAC IJ SOLR: 0.5000 mL | Freq: Once | INTRAMUSCULAR | Status: DC

## 2021-07-02 MED ORDER — FENTANYL CITRATE (PF) 100 MCG/2ML IJ SOLN
INTRAMUSCULAR | Status: AC
  Administered 2021-07-02: 100 ug via INTRAVENOUS
  Filled 2021-07-02: qty 2

## 2021-07-02 MED ORDER — ACETAMINOPHEN 325 MG PO TABS: 650.0000 mg | ORAL_TABLET | ORAL | Status: DC | PRN

## 2021-07-02 MED ORDER — OXYTOCIN-SODIUM CHLORIDE 30-0.9 UT/500ML-% IV SOLN
INTRAVENOUS | Status: AC
  Filled 2021-07-02: qty 500

## 2021-07-02 MED ORDER — FLEET ENEMA 7-19 GM/118ML RE ENEM: 1.0000 | ENEMA | RECTAL | Status: DC | PRN

## 2021-07-02 MED ORDER — COCONUT OIL OIL: 1.0000 "application " | TOPICAL_OIL | Status: DC | PRN

## 2021-07-02 MED ORDER — METOCLOPRAMIDE HCL 10 MG PO TABS
10.0000 mg | ORAL_TABLET | Freq: Once | ORAL | Status: AC
  Administered 2021-07-03: 10 mg via ORAL
  Filled 2021-07-02: qty 1

## 2021-07-02 MED ORDER — LACTATED RINGERS IV SOLN: 500.0000 mL | INTRAVENOUS | Status: DC | PRN

## 2021-07-02 MED ORDER — MAGNESIUM HYDROXIDE 400 MG/5ML PO SUSP
30.0000 mL | ORAL | Status: DC | PRN
  Administered 2021-07-04: 30 mL via ORAL
  Filled 2021-07-02: qty 30

## 2021-07-02 MED ORDER — ONDANSETRON HCL 4 MG/2ML IJ SOLN: 4.0000 mg | INTRAMUSCULAR | Status: DC | PRN

## 2021-07-02 MED ORDER — LIDOCAINE HCL (PF) 1 % IJ SOLN: 30.0000 mL | INTRAMUSCULAR | Status: DC | PRN

## 2021-07-02 MED ORDER — FAMOTIDINE 20 MG PO TABS
40.0000 mg | ORAL_TABLET | Freq: Once | ORAL | Status: AC
  Administered 2021-07-03: 40 mg via ORAL
  Filled 2021-07-02: qty 2

## 2021-07-02 MED ORDER — IBUPROFEN 600 MG PO TABS: 600.0000 mg | ORAL_TABLET | Freq: Four times a day (QID) | ORAL | Status: DC

## 2021-07-02 MED ORDER — OXYCODONE-ACETAMINOPHEN 5-325 MG PO TABS: 1.0000 | ORAL_TABLET | ORAL | Status: DC | PRN

## 2021-07-02 MED ORDER — IBUPROFEN 600 MG PO TABS
600.0000 mg | ORAL_TABLET | Freq: Four times a day (QID) | ORAL | Status: DC
  Administered 2021-07-02 – 2021-07-04 (×5): 600 mg via ORAL
  Filled 2021-07-02 (×7): qty 1

## 2021-07-02 MED ORDER — TETANUS-DIPHTH-ACELL PERTUSSIS 5-2.5-18.5 LF-MCG/0.5 IM SUSY
0.5000 mL | PREFILLED_SYRINGE | Freq: Once | INTRAMUSCULAR | Status: AC
  Administered 2021-07-04: 0.5 mL via INTRAMUSCULAR
  Filled 2021-07-02: qty 0.5

## 2021-07-02 MED ORDER — DIPHENHYDRAMINE HCL 25 MG PO CAPS: 25.0000 mg | ORAL_CAPSULE | Freq: Four times a day (QID) | ORAL | Status: DC | PRN

## 2021-07-02 MED ORDER — WITCH HAZEL-GLYCERIN EX PADS
1.0000 "application " | MEDICATED_PAD | CUTANEOUS | Status: DC | PRN
  Administered 2021-07-02: 1 via TOPICAL

## 2021-07-02 MED ORDER — SOD CITRATE-CITRIC ACID 500-334 MG/5ML PO SOLN: 30.0000 mL | ORAL | Status: DC | PRN

## 2021-07-02 MED ORDER — ONDANSETRON HCL 4 MG/2ML IJ SOLN: 4.0000 mg | Freq: Four times a day (QID) | INTRAMUSCULAR | Status: DC | PRN

## 2021-07-02 MED ORDER — FENTANYL CITRATE (PF) 100 MCG/2ML IJ SOLN: 100.0000 ug | Freq: Once | INTRAMUSCULAR | Status: AC

## 2021-07-02 MED ORDER — ONDANSETRON HCL 4 MG PO TABS: 4.0000 mg | ORAL_TABLET | ORAL | Status: DC | PRN

## 2021-07-02 NOTE — Lactation Note (Signed)
This note was copied from a baby's chart. Lactation Consultation Note  Patient Name: Kristi Roth Date: 07/02/2021   Age:23 hours   LC Note:  Attempted to visit with family, however, all members asleep at this time.  Will return later today.   Maternal Data    Feeding    LATCH Score Latch: Grasps breast easily, tongue down, lips flanged, rhythmical sucking.  Audible Swallowing: A few with stimulation  Type of Nipple: Everted at rest and after stimulation  Comfort (Breast/Nipple): Soft / non-tender  Hold (Positioning): Assistance needed to correctly position infant at breast and maintain latch.  LATCH Score: 8   Lactation Tools Discussed/Used    Interventions    Discharge    Consult Status      Kyanne Rials R Ronnett Pullin 07/02/2021, 11:44 AM

## 2021-07-02 NOTE — Progress Notes (Signed)
Post Partum Day 0 s/p precipitous VD in MAU at [redacted]w[redacted]d  Subjective: No complaints, up ad lib, voiding, and tolerating PO. Minimal pain. Baby stable at bedside, patient is breastfeeding.  Objective: Blood pressure (!) 103/53, pulse 93, temperature 99 F (37.2 C), temperature source Oral, resp. rate 16, last menstrual period 10/07/2020, SpO2 100 %, unknown if currently breastfeeding.  Physical Exam:  General: alert and no distress Lochia: appropriate Uterine Fundus: firm DVT Evaluation: No evidence of DVT seen on physical exam.  Recent Labs    07/02/21 0833  HGB 10.4*  HCT 32.0*   Assessment/Plan: Breastfeeding, doing well Desires BTL for contraception, scheduled for 07/03/21 at 1130, ordered placed. Medicaid papers had been signed on 06/13/2021, but given her preterm delivery, this is an exemption to the 30 day rule.  Other reversible forms of contraception including the most effective LARCs such as IUD or Nexplanon were discussed in detail with patient; she currently declines all other modalities.  Details of postpartum tubal sterilization discussed in detail.   She was told that this will be performed as either salpingectomy or occlusion with Filshie clips, depending on difficulty of procedure, exposure and other factors.  Risks of procedure discussed with patient including but not limited to: risk of regret, permanence of method, bleeding, infection, injury to surrounding organs and need for additional procedures.  Failure risk of about 1-2% with increased risk of ectopic gestation if pregnancy occurs was also discussed with patient.   Also discussed possibility of post-tubal syndrome with increased pelvic pain or menstrual irregularities. Really emphasized risk of regret, permanence and irreversibility of procedure.  Patient verbalized understanding of these risks and wants to proceed with sterilization.   NPO and other preoperative orders placed.  Will continue close observation and postpartum  care as ordered.     LOS: 0 days    Jaynie Collins, MD, FACOG Obstetrician & Gynecologist, [facility] for [employer], [facility]

## 2021-07-02 NOTE — MAU Note (Signed)
EMS arrival . Ctx every 3 min water broke prior to arrival clear fluid. . Pt uncomfortable and activly pusheing with ctx. C. Neil,CNM at bedside on arrival. Assisted pt to gurney and SVE 10/100/+1.  Pt pushed with next ctx and NSVD of female infant.

## 2021-07-02 NOTE — Lactation Note (Signed)
This note was copied from a baby's chart. Lactation Consultation Note  Patient Name: Kristi Roth Date: 07/02/2021   Age:23 hours   LC Note:  Attempted to visit with mother, however, she was on the phone.  Will return later.   Maternal Data    Feeding    LATCH Score Latch: Grasps breast easily, tongue down, lips flanged, rhythmical sucking.  Audible Swallowing: A few with stimulation  Type of Nipple: Everted at rest and after stimulation  Comfort (Breast/Nipple): Soft / non-tender  Hold (Positioning): Assistance needed to correctly position infant at breast and maintain latch.  LATCH Score: 8   Lactation Tools Discussed/Used    Interventions    Discharge    Consult Status      Kristi Roth R Liliyana Thobe 07/02/2021, 1:19 PM

## 2021-07-02 NOTE — Lactation Note (Signed)
This note was copied from a baby's chart. Lactation Consultation Note  Patient Name: Kristi Roth Date: 07/02/2021   Age:23 hours   LC Note:  Third attempt to visit with mother, however, she was beginning her lunch.  Asked her to call me when it is convenient after lunch.  Mother reported that baby has been latching.  Will continue education when mother calls for a visit.   Maternal Data    Feeding    LATCH Score Latch: Grasps breast easily, tongue down, lips flanged, rhythmical sucking.  Audible Swallowing: A few with stimulation  Type of Nipple: Everted at rest and after stimulation  Comfort (Breast/Nipple): Soft / non-tender  Hold (Positioning): Assistance needed to correctly position infant at breast and maintain latch.  LATCH Score: 8   Lactation Tools Discussed/Used    Interventions    Discharge    Consult Status      Sari Cogan R Azriel Jakob 07/02/2021, 1:20 PM
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
## 2021-07-02 NOTE — Lactation Note (Signed)
This note was copied from a baby's chart. Lactation Consultation Note  Patient Name: Kristi Roth [id number] Date: 07/02/2021 Reason for consult: Initial assessment;Late-preterm 34-36.6wks Age:23 hours   P2 mother whose infant is now 100 hours old.  This is a LPTI at 36+6 weeks.  Mother breast fed her first child (now 35 months old) for 5-6 months.  She stopped breast feeding because she was not "eating right."  Discussed eating habits with this baby and mother stated that she plans to eat healthy.    Baby was swaddled and asleep in the bassinet when I arrived.  Reviewed the LPTI policy guidelines with mother.  Presented her options of donor breast milk or formula for supplementation and mother will consider her options.  She feels like her daughter has latched and fed well for 5 minutes approximately 1 1/2 hours ago.  Initiated the DEBP and reviewed pump parts and settings.  #24 flange size is appropriate at this time.  Mother denies pain with pumping.  Provided coconut oil for comfort.  Instructed to feed baby at least every three hours or sooner if she shows feeding cues.  Reviewed cues and breast feeding basics.  Mother will call for lactation assistance as needed.  Mother is a [facility] participant in [place].  Referral faxed.  Suggested mother follow up tomorrow.  No support person present at this time but mother informed me that she would have assistance later today.  RN updated.   Maternal Data Has patient been taught Hand Expression?: Yes Does the patient have breastfeeding experience prior to this delivery?: Yes How long did the patient breastfeed?: 5-6 months with her first child  Feeding Mother's Current Feeding Choice: Breast Milk  LATCH Score                    Lactation Tools Discussed/Used Tools: Pump;Flanges;Coconut oil Flange Size: 24 Breast pump type: Double-Electric Breast Pump;Manual Pump Education: Setup, frequency, and cleaning;Milk Storage Reason for  Pumping: Breast stimulation, supplementation for LPTI Pumping frequency: Every three hours  Interventions    Discharge Pump: DEBP;Manual;Personal (Wishes to obtain a DEBP from [facility]) WIC Program: Yes  Consult Status Consult Status: Follow-up Date: 07/03/21 Follow-up type: In-patient    Cardelia Sassano R Juda Toepfer 07/02/2021, 3:04 PM

## 2021-07-03 ENCOUNTER — Encounter (HOSPITAL_COMMUNITY)
Admission: AD | Disposition: A | Discharge status: Home / Self Care | Payer: Self-pay | Source: Home / Self Care | Attending: Obstetrics and Gynecology

## 2021-07-03 ENCOUNTER — Inpatient Hospital Stay (HOSPITAL_COMMUNITY): Admit: 2021-07-03 | Payer: Medicaid Other | Admitting: Family Medicine

## 2021-07-03 ENCOUNTER — Inpatient Hospital Stay (HOSPITAL_COMMUNITY): Payer: Medicaid Other | Admitting: Anesthesiology

## 2021-07-03 ENCOUNTER — Encounter (HOSPITAL_COMMUNITY): Payer: Self-pay | Admitting: Obstetrics and Gynecology

## 2021-07-03 DIAGNOSIS — Z302 Encounter for sterilization: Secondary | ICD-10-CM

## 2021-07-03 HISTORY — PX: TUBAL LIGATION: SHX77

## 2021-07-03 SURGERY — LIGATION, FALLOPIAN TUBE, POSTPARTUM
Anesthesia: Epidural | Wound class: Clean Contaminated

## 2021-07-03 MED ORDER — MIDAZOLAM HCL 2 MG/2ML IJ SOLN
INTRAMUSCULAR | Status: AC
  Filled 2021-07-03: qty 2

## 2021-07-03 MED ORDER — FENTANYL CITRATE (PF) 100 MCG/2ML IJ SOLN
INTRAMUSCULAR | Status: DC | PRN
  Administered 2021-07-03: 100 ug via INTRAVENOUS

## 2021-07-03 MED ORDER — BUPIVACAINE HCL (PF) 0.25 % IJ SOLN
INTRAMUSCULAR | Status: DC | PRN
  Administered 2021-07-03: 30 mL

## 2021-07-03 MED ORDER — ONDANSETRON HCL 4 MG/2ML IJ SOLN
INTRAMUSCULAR | Status: DC | PRN
  Administered 2021-07-03: 4 mg via INTRAVENOUS

## 2021-07-03 MED ORDER — BUPIVACAINE HCL (PF) 0.25 % IJ SOLN
INTRAMUSCULAR | Status: AC
  Filled 2021-07-03: qty 30

## 2021-07-03 MED ORDER — ACETAMINOPHEN 500 MG PO TABS
1000.0000 mg | ORAL_TABLET | Freq: Four times a day (QID) | ORAL | Status: DC
  Administered 2021-07-03 – 2021-07-04 (×4): 1000 mg via ORAL
  Filled 2021-07-03 (×4): qty 2

## 2021-07-03 MED ORDER — CHLOROPROCAINE HCL 50 MG/5ML IT SOLN
INTRATHECAL | Status: AC
  Filled 2021-07-03: qty 5

## 2021-07-03 MED ORDER — FENTANYL CITRATE (PF) 100 MCG/2ML IJ SOLN
INTRAMUSCULAR | Status: AC
  Filled 2021-07-03: qty 2

## 2021-07-03 MED ORDER — DEXAMETHASONE SODIUM PHOSPHATE 4 MG/ML IJ SOLN
INTRAMUSCULAR | Status: DC | PRN
  Administered 2021-07-03: 8 mg via INTRAVENOUS

## 2021-07-03 MED ORDER — HYDROMORPHONE HCL 1 MG/ML IJ SOLN
INTRAMUSCULAR | Status: AC
  Filled 2021-07-03: qty 0.5

## 2021-07-03 MED ORDER — KETOROLAC TROMETHAMINE 30 MG/ML IJ SOLN
INTRAMUSCULAR | Status: AC
  Filled 2021-07-03: qty 1

## 2021-07-03 MED ORDER — PROMETHAZINE HCL 25 MG/ML IJ SOLN: 6.2500 mg | INTRAMUSCULAR | Status: DC | PRN

## 2021-07-03 MED ORDER — HYDROMORPHONE HCL 1 MG/ML IJ SOLN
0.2500 mg | INTRAMUSCULAR | Status: DC | PRN
  Administered 2021-07-03: 0.5 mg via INTRAVENOUS

## 2021-07-03 MED ORDER — SODIUM CHLORIDE 0.9 % IR SOLN
Status: DC | PRN
  Administered 2021-07-03: 1000 mL

## 2021-07-03 MED ORDER — DEXAMETHASONE SODIUM PHOSPHATE 4 MG/ML IJ SOLN
INTRAMUSCULAR | Status: AC
  Filled 2021-07-03: qty 2

## 2021-07-03 MED ORDER — STERILE WATER FOR IRRIGATION IR SOLN
Status: DC | PRN
  Administered 2021-07-03: 1000 mL

## 2021-07-03 MED ORDER — BUPIVACAINE IN DEXTROSE 0.75-8.25 % IT SOLN
INTRATHECAL | Status: DC | PRN
  Administered 2021-07-03: 1.4 mL via INTRATHECAL

## 2021-07-03 MED ORDER — KETOROLAC TROMETHAMINE 30 MG/ML IJ SOLN
30.0000 mg | Freq: Once | INTRAMUSCULAR | Status: AC | PRN
  Administered 2021-07-03: 30 mg via INTRAVENOUS

## 2021-07-03 MED ORDER — MEPERIDINE HCL 25 MG/ML IJ SOLN: 6.2500 mg | INTRAMUSCULAR | Status: DC | PRN

## 2021-07-03 MED ORDER — MIDAZOLAM HCL 5 MG/5ML IJ SOLN
INTRAMUSCULAR | Status: DC | PRN
  Administered 2021-07-03: 2 mg via INTRAVENOUS

## 2021-07-03 MED ORDER — ONDANSETRON HCL 4 MG/2ML IJ SOLN
INTRAMUSCULAR | Status: AC
  Filled 2021-07-03: qty 2

## 2021-07-03 SURGICAL SUPPLY — 21 items
BLADE SURG 11 STRL SS (BLADE) ×2 IMPLANT
DRESSING OPSITE X SMALL 2X3 (GAUZE/BANDAGES/DRESSINGS) ×2 IMPLANT
DRSG OPSITE POSTOP 3X4 (GAUZE/BANDAGES/DRESSINGS) ×2 IMPLANT
DURAPREP 26ML APPLICATOR (WOUND CARE) ×2 IMPLANT
GLOVE BIOGEL PI IND STRL 7.0 (GLOVE) ×1 IMPLANT
GLOVE BIOGEL PI IND STRL 7.5 (GLOVE) ×1 IMPLANT
GLOVE BIOGEL PI INDICATOR 7.0 (GLOVE) ×1
GLOVE BIOGEL PI INDICATOR 7.5 (GLOVE) ×1
GLOVE ECLIPSE 7.5 STRL STRAW (GLOVE) ×2 IMPLANT
GOWN STRL REUS W/TWL LRG LVL3 (GOWN DISPOSABLE) ×4 IMPLANT
HIBICLENS CHG 4% 4OZ BTL (MISCELLANEOUS) ×2 IMPLANT
NEEDLE HYPO 22GX1.5 SAFETY (NEEDLE) ×2 IMPLANT
NS IRRIG 1000ML POUR BTL (IV SOLUTION) ×2 IMPLANT
PACK ABDOMINAL MINOR (CUSTOM PROCEDURE TRAY) ×2 IMPLANT
PROTECTOR NERVE ULNAR (MISCELLANEOUS) ×2 IMPLANT
SPONGE LAP 4X18 RFD (DISPOSABLE) IMPLANT
SUT VICRYL 0 UR6 27IN ABS (SUTURE) ×2 IMPLANT
SUT VICRYL 4-0 PS2 18IN ABS (SUTURE) ×2 IMPLANT
SYR CONTROL 10ML LL (SYRINGE) ×2 IMPLANT
TOWEL OR 17X24 6PK STRL BLUE (TOWEL DISPOSABLE) ×4 IMPLANT
TRAY FOLEY W/BAG SLVR 14FR (SET/KITS/TRAYS/PACK) ×2 IMPLANT

## 2021-07-03 NOTE — Op Note (Addendum)
Operative Note   Kristi Roth   SURGERY DATE: 07/03/2021  PRE-OP DIAGNOSIS: Multiparity, undesired Fertility  POST-OP DIAGNOSIS: Same   PROCEDURE: Postpartum bilateral tubal ligation using Pomeroy technique  SURGEON: Surgeon(s) and Role:    * Stinson, Rhona Raider, DO - Primary    * Abdirahim Flavell, Hiram Comber, DO - Assisting  ASSISTANT: Jen Mow, DO   ANESTHESIA: epidural  COMPLICATIONS:  None immediate.  ESTIMATED BLOOD LOSS:  Less than 5 ml.  FLUIDS: 400 ml LR.  URINE OUTPUT:  150 cc clear urine.  INDICATIONS: 25 y.o. [id number]  with undesired fertility,status post vaginal delivery, desires permanent sterilization. Risks and benefits of procedure discussed with patient including permanence of method, bleeding, infection, injury to surrounding organs and need for additional procedures. Risk failure of 0.5-1% with increased risk of ectopic gestation if pregnancy occurs was also discussed with patient.   FINDINGS:  Normal uterus, tubes, and ovaries.  TECHNIQUE:  The patient was taken to the operating room where her epidural anesthesia was dosed up to surgical level and found to be adequate.  She was then placed in the dorsal supine position and prepped and draped in sterile fashion.  After an adequate timeout was performed, attention was turned to the patient's abdomen where a small transverse skin incision was made under the umbilical fold. The incision was taken down to the layer of fascia using the scalpel, and fascia was incised, and extended bilaterally using Mayo scissors. The peritoneum was entered in a sharp fashion. Attention was then turned to the patient's uterus.  The left Fallopian tube was identified, grasped with the Babcock clamps. An avascular midsection of the tube approximately 3-4cm from the cornua was grasped with the babcock clamps and brought into a knuckle. The tube was double ligated with one 0 plain gut suture and the intervening portion of tube was transected  and removed.  Attention was then turned to the right fallopian tube after confirmation of identification by tracing the tube out to the fimbriae. The same procedure was then performed on the right Fallopian tube, with excellent hemostasis noted at both BTL sites at the end of the procedure.   Good hemostasis was noted overall.  The instruments were then removed from the patient's abdomen and the fascial incision was repaired with 0 Vicryl, and the skin was closed with a 4-0 Monocryl subcuticular stitch. 30cc of 0.25% Marcaine given subcutaneously at incision site. The patient tolerated the procedure well.  Sponge, lap, and needle counts were correct times two.  The patient was then taken to the recovery room awake, extubated and in stable condition.    Jen Mow, DO [facility] for [employer], [facility] 07/03/2021  12:37 PM

## 2021-07-03 NOTE — Lactation Note (Addendum)
This note was copied from a baby's chart. Lactation Consultation Note  Patient Name: Girl Ilina Xu [id number] Date: 07/03/2021  Mom in pain following a procedure. LC not able to see her at this time.  Mom supplementing with DBM after breastfeeding. LC to return when Mom stable.  Age:23 hours Mom feeling better. LC examined oral anatomy infant has thick labial attachment and high palate but no signs of lingual attachment. Infant able to sustain the latch with chin tug to ensure lips are flanged outward. LC assisted Mom latching in football on the right breast with signs of milk transfer with breast compression for 12 mins. Mom just had a tubal ligation had some cramping with latching so heat provided to ease discomfort.   Mom wanted to end feeding and give DBM, currently offering 24 ml with paced bottle feeding with yellow slow flow nipple at the end of the visit. LC reviewed with Mom importance to pump using DEBP q 3hrs after each feeding to maintain her milk supply.  LC reviewed breastfeeding supplementation guide. Mom aware to offer more if infant still hungry  All questions answered at the end of the visit.   Maternal Data    Feeding    LATCH Score                    Lactation Tools Discussed/Used    Interventions    Discharge    Consult Status      Koray Soter  Nicholson-Springer 07/03/2021, 5:03 PM

## 2021-07-03 NOTE — Anesthesia Preprocedure Evaluation (Signed)
Anesthesia Evaluation  Patient identified by MRN, date of birth, ID band Patient awake    Reviewed: Allergy & Precautions, H&P , NPO status , Patient's Chart, lab work & pertinent test results  Airway Mallampati: I  TM Distance: >3 FB Neck ROM: full    Dental no notable dental hx. (+) Teeth Intact   Pulmonary neg pulmonary ROS,    Pulmonary exam normal breath sounds clear to auscultation       Cardiovascular negative cardio ROS Normal cardiovascular exam Rhythm:regular Rate:Normal     Neuro/Psych negative neurological ROS  negative psych ROS   GI/Hepatic negative GI ROS, Neg liver ROS,   Endo/Other  negative endocrine ROS  Renal/GU negative Renal ROS  negative genitourinary   Musculoskeletal negative musculoskeletal ROS (+)   Abdominal Normal abdominal exam  (+)   Peds  Hematology negative hematology ROS (+)   Anesthesia Other Findings   Reproductive/Obstetrics (+) Pregnancy                             Anesthesia Physical  Anesthesia Plan  ASA: 2  Anesthesia Plan: Epidural   Post-op Pain Management:    Induction:   PONV Risk Score and Plan: Ondansetron, Dexamethasone, Midazolam and Scopolamine patch - Pre-op  Airway Management Planned: Natural Airway, Nasal Cannula and Simple Face Mask  Additional Equipment: None  Intra-op Plan:   Post-operative Plan:   Informed Consent: I have reviewed the patients History and Physical, chart, labs and discussed the procedure including the risks, benefits and alternatives for the proposed anesthesia with the patient or authorized representative who has indicated his/her understanding and acceptance.       Plan Discussed with: CRNA  Anesthesia Plan Comments:         Anesthesia Quick Evaluation

## 2021-07-03 NOTE — Discharge Summary (Signed)
Postpartum Discharge Summary      Patient Name: Kristi Roth DOB: 1996-02-19 MRN: [id number]  Date of admission: 07/02/2021 Delivery date:07/02/2021  Delivering provider: Wende Mott  Date of discharge: 07/04/2021  Admitting diagnosis: Normal labor [O80, Z37.9] Intrauterine pregnancy: [redacted]w[redacted]d    Secondary diagnosis:  Active Problems:   Unwanted fertility   Normal labor   Vaginal delivery  Additional problems: none    Discharge diagnosis: Term Pregnancy Delivered                                              Post partum procedures:postpartum tubal ligation Augmentation: N/A Complications: None  Hospital course: Onset of Labor With Vaginal Delivery      25y.o. yo [id number] admitted in Active Labor on 07/02/2021. Patient had an uncomplicated labor course as follows:  Membrane Rupture Time/Date: 6:00 AM ,07/02/2021   Delivery Method:Vaginal, Spontaneous  Episiotomy: None  Lacerations:    Patient had an uncomplicated postpartum course.  She had an uncomplicated postpartum tubal ligation. She is ambulating, tolerating a regular diet, passing flatus, and urinating well. Patient is discharged home in stable condition on 07/04/21.  Newborn Data: Birth date:07/02/2021  Birth time:6:56 AM  Gender:Female  Living status:Living  Apgars:8 ,9  Weight:2846 g   Magnesium Sulfate received: No BMZ received: No Rhophylac:N/A MMR:N/A T-DaP:offered postpartum Flu: No Transfusion:No  Physical exam  Vitals:   07/03/21 1410 07/03/21 1830 07/03/21 2308 07/04/21 0505  BP: (!) 113/44 (!) 111/54 (!) 104/58 117/65  Pulse: 67 73 77 71  Resp: 16 16 18 18   Temp: 98.2 F (36.8 C)  98.5 F (36.9 C) 98.3 F (36.8 C)  TempSrc: Oral  Oral Oral  SpO2: 100% 99% 100% 100%   General: alert, cooperative, and no distress Lochia: appropriate Uterine Fundus: firm Incision: Healing well with no significant drainage DVT Evaluation: No evidence of DVT seen on physical exam. Labs: Lab  Results  Component Value Date   WBC 11.0 (H) 07/02/2021   HGB 10.4 (L) 07/02/2021   HCT 32.0 (L) 07/02/2021   MCV 83.3 07/02/2021   PLT 252 07/02/2021   CMP Latest Ref Rng & Units 02/03/2021  Glucose 70 - 99 mg/dL 109(H)  BUN 6 - 20 mg/dL 5(L)  Creatinine 0.44 - 1.00 mg/dL 0.56  Sodium 135 - 145 mmol/L 134(L)  Potassium 3.5 - 5.1 mmol/L 3.3(L)  Chloride 98 - 111 mmol/L 105  CO2 22 - 32 mmol/L 18(L)  Calcium 8.9 - 10.3 mg/dL 8.7(L)  Total Protein 6.5 - 8.1 g/dL 6.4(L)  Total Bilirubin 0.3 - 1.2 mg/dL 1.1  Alkaline Phos 38 - 126 U/L 53  AST 15 - 41 U/L 18  ALT 0 - 44 U/L 9   Edinburgh Score: Edinburgh Postnatal Depression Scale Screening Tool 07/02/2021  I have been able to laugh and see the funny side of things. (No Data)  I have looked forward with enjoyment to things. -  I have blamed myself unnecessarily when things went wrong. -  I have been anxious or worried for no good reason. -  I have felt scared or panicky for no good reason. -  Things have been getting on top of me. -  I have been so unhappy that I have had difficulty sleeping. -  I have felt sad or miserable. -  I have been  so unhappy that I have been crying. -  The thought of harming myself has occurred to me. Flavia Shipper Postnatal Depression Scale Total -     After visit meds:  Allergies as of 07/04/2021       Reactions   Latex Dermatitis        Medication List     STOP taking these medications    valACYclovir 500 MG tablet Commonly known as: Valtrex       TAKE these medications    acetaminophen 500 MG tablet Commonly known as: TYLENOL Take 2 tablets (1,000 mg total) by mouth every 6 (six) hours.   coconut oil Oil Apply 1 application topically as needed.   ibuprofen 600 MG tablet Commonly known as: ADVIL Take 1 tablet (600 mg total) by mouth every 6 (six) hours.   Vitafol Ultra 29-0.6-0.4-200 MG Caps Take 1 capsule by mouth daily before breakfast.         Discharge home in stable  condition Infant Feeding: Breast Infant Disposition:home with mother Discharge instruction: per After Visit Summary and Postpartum booklet. Activity: Advance as tolerated. Pelvic rest for 6 weeks.  Diet: routine diet Future Appointments: Future Appointments  Date Time Provider [place]  08/13/2021  2:10 PM Leftwich-Kirby, Kathie Dike, CNM [facility] None   Follow up Visit:   Please schedule this patient for a In person postpartum visit in 6 weeks with the following provider: Any provider. Additional Postpartum F/U: none   Low risk pregnancy complicated by:  none Delivery mode:  Vaginal, Spontaneous  Anticipated Birth Control:  BTL done [facility]   07/04/2021 Janet Berlin, MD

## 2021-07-03 NOTE — Anesthesia Postprocedure Evaluation (Signed)
Anesthesia Post Note  Patient: Kristi Roth  Procedure(s) Performed: POST PARTUM TUBAL LIGATION     Patient location during evaluation: PACU Anesthesia Type: Spinal Level of consciousness: awake Pain management: pain level controlled Vital Signs Assessment: post-procedure vital signs reviewed and stable Respiratory status: spontaneous breathing Cardiovascular status: stable Postop Assessment: no headache, no backache, spinal receding and no apparent nausea or vomiting Anesthetic complications: no   No notable events documented.  Last Vitals:  Vitals:   07/03/21 1330 07/03/21 1345  BP: (!) 106/48 (!) 113/59  Pulse: 75 88  Resp: (!) 23 14  Temp: 36.7 C   SpO2: 100% 100%    Last Pain:  Vitals:   07/03/21 1330  TempSrc:   PainSc: 8    Pain Goal: Patients Stated Pain Goal: 5 (07/02/21 2000)  LLE Motor Response: No movement due to regional block (07/03/21 1345) LLE Sensation: Tingling (07/03/21 1345) RLE Motor Response: No movement due to regional block (07/03/21 1345) RLE Sensation: Tingling (07/03/21 1345)     Epidural/Spinal Function Cutaneous sensation: Tingles (07/03/21 1345), Patient able to flex knees: No (07/03/21 1345), Patient able to lift hips off bed: No (07/03/21 1345), Back pain beyond tenderness at insertion site: No (07/03/21 1345), Progressively worsening motor and/or sensory loss: No (07/03/21 1345), Bowel and/or bladder incontinence post epidural: No (07/03/21 1345)  Caren Macadam

## 2021-07-03 NOTE — Progress Notes (Signed)
Post Partum Day 1 Subjective: no complaints, up ad lib, and voiding  Objective: Blood pressure 115/71, pulse 79, temperature 98.2 F (36.8 C), temperature source Oral, resp. rate 16, last menstrual period 10/07/2020, SpO2 100 %, unknown if currently breastfeeding.  Physical Exam:  General: alert, cooperative, and no distress Lochia: appropriate Uterine Fundus: firm Incision: n/a DVT Evaluation: No evidence of DVT seen on physical exam. Negative Homan's sign. No cords or calf tenderness. No significant calf/ankle edema.  Recent Labs    07/02/21 0833  HGB 10.4*  HCT 32.0*    Assessment/Plan: Plan for discharge tomorrow PP BTL today: Discussed that this is a permanent contraception. Risks of procedure discussed with patient including but not limited to: risk of regret, permanence of method, bleeding, infection, injury to surrounding organs and need for additional procedures.  Failure risk of 1 -2 % with increased risk of ectopic gestation if pregnancy occurs was also discussed with patient.    Kristi Heritage, DO 07/03/2021 9:07 AM    LOS: 1 day   Kristi Roth 07/03/2021, 9:06 AM

## 2021-07-03 NOTE — Progress Notes (Signed)
RN called L&D first call to verify BTL procedure today with consent papers signed 06/13/2021.  Stinson MD verified procedure can be done since patient gave birth pre-term. RN will proceed with pre-procedure checklist.

## 2021-07-03 NOTE — Transfer of Care (Signed)
Immediate Anesthesia Transfer of Care Note  Patient: [name]  Procedure(s) Performed: POST PARTUM TUBAL LIGATION  Patient Location: PACU  Anesthesia Type:Spinal  Level of Consciousness: awake, alert  and oriented  Airway & Oxygen Therapy: Patient Spontanous Breathing  Post-op Assessment: Report given to RN and Post -op Vital signs reviewed and stable  Post vital signs: Reviewed and stable  Last Vitals:  Vitals Value Taken Time  BP 112/57 07/03/21 1248  Temp    Pulse 64 07/03/21 1249  Resp 14 07/03/21 1249  SpO2 100 % 07/03/21 1249  Vitals shown include unvalidated device data.  Last Pain:  Vitals:   07/03/21 0730  TempSrc:   PainSc: Asleep      Patients Stated Pain Goal: 5 (07/02/21 2000)  Complications: No notable events documented.

## 2021-07-04 ENCOUNTER — Other Ambulatory Visit: Payer: Self-pay | Admitting: Family Medicine

## 2021-07-04 ENCOUNTER — Other Ambulatory Visit: Payer: Self-pay | Admitting: Student in an Organized Health Care Education/Training Program

## 2021-07-04 MED ORDER — IBUPROFEN 600 MG PO TABS: 600.0000 mg | ORAL_TABLET | Freq: Four times a day (QID) | ORAL | 1 refills | Status: DC

## 2021-07-04 MED ORDER — ACETAMINOPHEN 500 MG PO TABS
1000.0000 mg | ORAL_TABLET | Freq: Four times a day (QID) | ORAL | 1 refills | Status: DC
Start: 2021-07-04 — End: 2022-05-08

## 2021-07-04 MED ORDER — OXYCODONE HCL 5 MG PO TABS: 5.0000 mg | ORAL_TABLET | Freq: Four times a day (QID) | ORAL | Status: DC | PRN

## 2021-07-04 MED ORDER — OXYCODONE HCL 5 MG PO TABS
5.0000 mg | ORAL_TABLET | Freq: Once | ORAL | Status: AC
  Administered 2021-07-04: 5 mg via ORAL
  Filled 2021-07-04: qty 1

## 2021-07-04 MED ORDER — COCONUT OIL OIL: 1.0000 "application " | TOPICAL_OIL | 0 refills | Status: DC | PRN

## 2021-07-04 MED ORDER — ACETAMINOPHEN 500 MG PO TABS: 1000.0000 mg | ORAL_TABLET | Freq: Four times a day (QID) | ORAL | 0 refills | Status: DC

## 2021-07-04 MED ORDER — IBUPROFEN 600 MG PO TABS: 600.0000 mg | ORAL_TABLET | Freq: Four times a day (QID) | ORAL | 0 refills | Status: DC

## 2021-07-04 NOTE — Lactation Note (Signed)
This note was copied from a baby's chart. Lactation Consultation Note  Patient Name: Kristi Roth OHYWV'P Date: 07/04/2021 Reason for consult: Follow-up assessment Age:23 Hours  Mother reports that she is breastfeeding and supplementing infant with DBM.  Mother reports that she is pumping but still not getting any milk . Mother reports that she pumped two times yesterday. Suggested that she try to pump with the hand pump.   Hand pump instructions and assistance given. #24 flange used. Mother pumped for several mins and milk sprayed into flange. Observed about 5 mls. Encouraged mother to pump 15 mins on each breast. Mother reports that her breast are beginning to feel fuller.  Encouraged mother to continue to pump every 3 hours for 15 mins on each breast or use the DEBP q 3 hours for 15-20 mins. Mother reports understanding of need to pump.   Mother reports that [facility] has not called her yet. She was informed that the [facility] office was closed on 4 th. LC gave her the [facility] office phone number and suggested that she call and try to reach them.     Maternal Data    Feeding Mother's Current Feeding Choice: Breast Milk  LATCH Score                    Lactation Tools Discussed/Used Flange Size: 24 Breast pump type: Manual Pump Education: Setup, frequency, and cleaning;Milk Storage Reason for Pumping: lactation induction Pumping frequency: q 3 hours for 15 mins Pumped volume: 5 mL  Interventions    Discharge    Consult Status Consult Status: Follow-up Date: 07/05/21 Follow-up type: In-patient    [name] [facility] 07/04/2021, 11:54 AM

## 2021-07-04 NOTE — Progress Notes (Signed)
Rx for tylenol and ibuprofen sent to pharmacy per patient request.

## 2021-07-04 NOTE — Social Work (Signed)
CSW received and acknowledges consult for EDPS of 9.  Consult screened out due to 9 on EDPS does not warrant a CSW consult.  MOB whom scores are greater than 9/yes to question 10 on Edinburgh Postpartum Depression Screen warrants a CSW consult.   Amish Mintzer, MSW, LCSWA Clinical Social Work [facility] [phone] 

## 2021-07-05 ENCOUNTER — Ambulatory Visit: Payer: Self-pay

## 2021-07-05 LAB — SURGICAL PATHOLOGY

## 2021-07-05 NOTE — Lactation Note (Signed)
This note was copied from a baby's chart. Lactation Consultation Note  Patient Name: Kristi Roth [id number] Date: 07/05/2021 Reason for consult: Follow-up assessment Age:23 hours  Mom in pain from tubal.  She states infant is feeding well.  She breast fed for 30 minutes at 6am then took 69ml of DBM at 8 am. She is pumping every 3 hours now.  Mom has an appt. Today with WIC to get her pump.  She had questions regarding milk storage.  All questions answered.    She attempted to latch infant while LC was in the room but infant was too sleepy.  LC reviewed hand expression with mom as well as engorgement prevention.    Mom goes to [facility] and [facility] highly encouraged mom to see Soyla Dryer RN, [facility] for follow up.  Mom understands the [facility] can help assess infants milk transfer and provide future guidance on pumping needs/ supplementation needs for infant.    Storage bottles and nursing pads provided for mom.    Maternal Data Has patient been taught Hand Expression?: Yes  Feeding Mother's Current Feeding Choice: Breast Milk  LATCH Score Latch: Too sleepy or reluctant, no latch achieved, no sucking elicited.  Audible Swallowing: None  Type of Nipple: Everted at rest and after stimulation  Comfort (Breast/Nipple): Soft / non-tender  Hold (Positioning): No assistance needed to correctly position infant at breast.  LATCH Score: 6   Lactation Tools Discussed/Used Tools: Pump Flange Size: 24 Breast pump type: Double-Electric Breast Pump Pump Education: Setup, frequency, and cleaning;Milk Storage Reason for Pumping: supplement with EBM/ LPTI Pumping frequency: q 3 hours for 15 Pumped volume: 80 mL  Interventions Interventions: Breast feeding basics reviewed;Breast compression;Adjust position;Skin to skin;Support pillows;Breast massage  Discharge Discharge Education: Engorgement and breast care;Warning signs for feeding baby;Outpatient recommendation  Consult Status Consult  Status: Complete Date: 07/05/21 Follow-up type: In-patient    Maryruth Hancock [facility] 07/05/2021, 9:17 AM

## 2021-07-05 NOTE — Lactation Note (Signed)
This note was copied from a baby's chart. Lactation Consultation Note  Patient Name: Kristi Roth OEVOJ'J Date: 07/05/2021 Reason for consult: Follow-up assessment;Mother's request;Late-preterm 34-36.6wks;Hyperbilirubinemia;Infant weight loss (Infant with -7% weight loss.) Age:23 days LC entered room, infant had pacifier in mouth and was smacking and cuing once pacifier was removed. LC discussed with mom to wait until 3 weeks to offer pacifier it may cues out hunger cues, cause disorganized suck and infant may refuse breast.  Mom requested LC services, LC did not assist with latch, mom had questions regarding milk storage. Per mom, infant latches well but she has not latched infant today since being on billi lights. LC did not observe latch at this time. Per mom, infant was given 30 mls of EBM less than 30 minutes prior to [facility] entering the room, Infant was still cuing and was given additional 10 mls of EBM using slow flow bottle nipple ( yellow). Per mom, she will start back latching infant at breast and knows to limit total infant feeding to 30 minutes or less, LC reviewed LPTI feeding policy and gave mom another green sheet.  Per mom, she has been pumping every 2 or 3 hours and now pumping 80 mls per pumping session and she talked with [facility] about getting a DEBP once discharged from the hospital. [facility] discussed with mom when not latching infant at breast for 3 day old infant  to offer 40 to 45 mls of EBM based on infant 's age / hours of life and when latched at breast to offer 30+ mls per feeding until infant regains weight that has been lost. Mom will keep infant on billi lights as advised by Pediatrician.  Maternal Data    Feeding Mother's Current Feeding Choice: Breast Milk Nipple Type: Slow - flow  LATCH Score                    Lactation Tools Discussed/Used Tools: Pump Breast pump type: Double-Electric Breast Pump Reason for Pumping: Infant being LPTI and infant with on  billi lights.  Interventions    Discharge Pump: DEBP WIC Program: Yes  Consult Status Consult Status: Follow-up Date: 07/06/21 Follow-up type: In-patient    Danelle Earthly 07/05/2021, 9:57 PM

## 2021-07-06 ENCOUNTER — Ambulatory Visit: Payer: Self-pay

## 2021-07-06 NOTE — Lactation Note (Signed)
This note was copied from a baby's chart. Lactation Consultation Note  Patient Name: Kristi Roth Date: 07/06/2021 Reason for consult: Follow-up assessment;Late-preterm 34-36.6wks Age:23 days  Baby [redacted]w[redacted]d and sleeping.  Mother has been pumping up to 90 ml.  Discussed frequency.   Mother will be getting DEBP. Feed on demand with cues.  Goal 8-12+ times per day after first 24 hrs.  Place baby STS if not cueing.  Reviewed engorgement care and monitoring voids/stools.   Feeding Mother's Current Feeding Choice: Breast Milk   Lactation Tools Discussed/Used Tools: Pump Breast pump type: Double-Electric Breast Pump Pump Education: Milk Storage Reason for Pumping: stimulation and supplementation Pumping frequency: q 3hours Pumped volume: 90 mL  Interventions Interventions: Breast feeding basics reviewed;Education;DEBP  Discharge Discharge Education: Engorgement and breast care;Warning signs for feeding baby Pump: DEBP  Consult Status Consult Status: Complete Date: 07/06/21    Dahlia Byes [facility] 07/06/2021, 10:32 AM

## 2021-07-09 ENCOUNTER — Encounter: Payer: Medicaid Other | Admitting: Advanced Practice Midwife

## 2021-07-12 ENCOUNTER — Telehealth (HOSPITAL_COMMUNITY): Payer: Self-pay | Admitting: *Deleted

## 2021-07-12 NOTE — Telephone Encounter (Signed)
Mom reports doing well with no concerns. EPDS = 7 (Hospital score = 9) Reports baby is doing well. Breastfeeding without difficulty. No concerns.  Duffy Rhody, RN 07/12/2021 at 2:17pm

## 2021-08-13 ENCOUNTER — Encounter: Payer: Self-pay | Admitting: Advanced Practice Midwife

## 2021-08-13 ENCOUNTER — Ambulatory Visit (INDEPENDENT_AMBULATORY_CARE_PROVIDER_SITE_OTHER): Payer: Medicaid Other | Admitting: Advanced Practice Midwife

## 2021-08-13 ENCOUNTER — Other Ambulatory Visit: Payer: Self-pay

## 2021-08-13 DIAGNOSIS — R87610 Atypical squamous cells of undetermined significance on cytologic smear of cervix (ASC-US): Secondary | ICD-10-CM | POA: Insufficient documentation

## 2021-08-13 NOTE — Progress Notes (Signed)
Post Partum Visit Note  Kristi Roth is a 25 y.o. G40P1102 female who presents for a postpartum visit. She is 6 weeks postpartum following a normal spontaneous vaginal delivery.  I have fully reviewed the prenatal and intrapartum course. The delivery was at 36 gestational weeks.  Anesthesia: none. Postpartum course has been uncomplications. Baby is doing well. Baby is feeding by breast. Bleeding staining only. Bowel function is normal. Bladder function is normal. Patient is not sexually active. Contraception method is tubal ligation. Postpartum depression screening: negative.   The pregnancy intention screening data noted above was reviewed. Potential methods of contraception were discussed. The patient elected to proceed with No data recorded.   Edinburgh Postnatal Depression Scale - 08/13/21 1440       Edinburgh Postnatal Depression Scale:  In the Past 7 Days   I have been able to laugh and see the funny side of things. 0    I have looked forward with enjoyment to things. 0    I have blamed myself unnecessarily when things went wrong. 1    I have been anxious or worried for no good reason. 0    I have felt scared or panicky for no good reason. 0    Things have been getting on top of me. 0    I have been so unhappy that I have had difficulty sleeping. 0    I have felt sad or miserable. 0    I have been so unhappy that I have been crying. 0    The thought of harming myself has occurred to me. 0    Edinburgh Postnatal Depression Scale Total 1             Health Maintenance Due  Topic Date Due   COVID-19 Vaccine (1) Never done   HPV VACCINES (1 - 2-dose series) Never done   INFLUENZA VACCINE  07/30/2021    The following portions of the patient's history were reviewed and updated as appropriate: allergies, current medications, past family history, past medical history, past social history, past surgical history, and problem list.  Review of Systems Pertinent items noted in  HPI and remainder of comprehensive ROS otherwise negative.  Objective:  BP 112/69   Pulse 80   Ht 5\' 7"  (1.702 m)   Wt 191 lb (86.6 kg)   LMP 08/03/2021   Breastfeeding Yes   BMI 29.91 kg/m    VS reviewed, nursing note reviewed,  Constitutional: well developed, well nourished, no distress HEENT: normocephalic CV: normal rate Pulm/chest wall: normal effort Abdomen: soft Neuro: alert and oriented x 3 Skin: warm, dry Psych: affect normal    Assessment:  1. Postpartum care following vaginal delivery --Doing well, bonding well with baby, good support at home.  Normal postpartum exam.   Plan:   Essential components of care per ACOG recommendations:  1.  Mood and well being: Patient with negative depression screening today. Reviewed local resources for support.  - Patient tobacco use? No.   - hx of drug use? No.    2. Infant care and feeding:  -Patient currently breastmilk feeding? Yes. Discussed returning to work and pumping. Reviewed importance of draining breast regularly to support lactation.  -Social determinants of health (SDOH) reviewed in EPIC. No concerns  3. Sexuality, contraception and birth spacing - Patient does not want a pregnancy in the next year.  Desired family size is 2 children.  - PP BTL completed.  --Reviewed contraception as options to manage menses  as desired, pt will notify office if desired.  4. Sleep and fatigue -Encouraged family/partner/community support of 4 hrs of uninterrupted sleep to help with mood and fatigue  5. Physical Recovery  - Discussed patients delivery and complications. She describes her labor as good. - Patient had a Vaginal, no problems at delivery. Patient had no laceration. Perineal healing reviewed. Patient expressed understanding - Patient has urinary incontinence? No. - Patient is safe to resume physical and sexual activity  6.  Health Maintenance - HM due items addressed Yes - Last pap smear  Diagnosis  Date  Value Ref Range Status  03/05/2021 (A)  Final   - Atypical squamous cells of undetermined significance (ASC-US)   Pap smear not done at today's visit.  -Breast Cancer screening indicated? No.   7. Chronic Disease/Pregnancy Condition follow up: None  - PCP follow up  Sharen Counter, CNM [facility] [employer], [facility]

## 2021-08-19 ENCOUNTER — Ambulatory Visit (HOSPITAL_COMMUNITY)
Admission: EM | Admit: 2021-08-19 | Discharge: 2021-08-19 | Disposition: A | Discharge status: Other Acute Inpatient Hospital | Payer: Medicaid Other

## 2021-08-19 ENCOUNTER — Other Ambulatory Visit: Payer: Self-pay

## 2021-08-21 ENCOUNTER — Ambulatory Visit
Admission: EM | Admit: 2021-08-21 | Discharge: 2021-08-21 | Disposition: A | Discharge status: Home / Self Care | Payer: Medicaid Other

## 2021-08-21 DIAGNOSIS — S134XXA Sprain of ligaments of cervical spine, initial encounter: Secondary | ICD-10-CM | POA: Diagnosis not present
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
## 2021-09-14 ENCOUNTER — Other Ambulatory Visit: Payer: Self-pay

## 2021-09-14 ENCOUNTER — Ambulatory Visit
Admission: EM | Admit: 2021-09-14 | Discharge: 2021-09-14 | Disposition: A | Discharge status: Home / Self Care | Payer: Medicaid Other | Attending: Urgent Care | Admitting: Urgent Care

## 2021-09-14 DIAGNOSIS — N898 Other specified noninflammatory disorders of vagina: Secondary | ICD-10-CM | POA: Insufficient documentation

## 2021-09-14 DIAGNOSIS — J069 Acute upper respiratory infection, unspecified: Secondary | ICD-10-CM | POA: Insufficient documentation

## 2021-09-14 DIAGNOSIS — Z8619 Personal history of other infectious and parasitic diseases: Secondary | ICD-10-CM

## 2021-09-14 MED ORDER — PSEUDOEPHEDRINE HCL 30 MG PO TABS: 30.0000 mg | ORAL_TABLET | Freq: Three times a day (TID) | ORAL | 0 refills | Status: DC | PRN

## 2021-09-14 MED ORDER — CETIRIZINE HCL 10 MG PO TABS: 10.0000 mg | ORAL_TABLET | Freq: Every day | ORAL | 0 refills | Status: DC

## 2021-09-14 NOTE — ED Triage Notes (Signed)
Pt c/o cough, sore throat onset last night with headache, congestion, drainage.

## 2021-09-14 NOTE — ED Triage Notes (Signed)
Also asks for sti screening
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
## 2021-09-14 NOTE — Discharge Instructions (Addendum)
We will notify you of your COVID-19 test results as they arrive and may take between 48-72 hours.  I encourage you to sign up for MyChart if you have not already done so as this can be the easiest way for [place] to communicate results to you online or through a phone app.  Generally, we only contact you if it is a positive COVID result.  In the meantime, if you develop worsening symptoms including fever, chest pain, shortness of breath despite our current treatment plan then please report to the emergency room as this may be a sign of worsening status from possible COVID-19 infection.  Otherwise, we will manage this as a viral syndrome. For sore throat or cough try using a honey-based tea. Use 3 teaspoons of honey with juice squeezed from half lemon. Place shaved pieces of ginger into 1/2-1 cup of water and warm over stove top. Then mix the ingredients and repeat every 4 hours as needed. Please take Tylenol 500mg -650mg  every 6 hours for aches and pains, fevers. Hydrate very well with at least 2 liters of water. Eat light meals such as soups to replenish electrolytes and soft fruits, veggies. Start an antihistamine like Zyrtec, Allegra or Claritin for postnasal drainage, sinus congestion.  You can take this together with pseudoephedrine (Sudafed) at a dose of 30 mg 2-3 times a day as needed for the same kind of congestion.    We will let you know which your test results are from the cervical swab and what kind of treatment you would need from it.

## 2021-09-15 LAB — SARS-COV-2, NAA 2 DAY TAT

## 2021-09-15 LAB — NOVEL CORONAVIRUS, NAA: SARS-CoV-2, NAA: DETECTED — AB

## 2021-09-17 LAB — CERVICOVAGINAL ANCILLARY ONLY
Bacterial Vaginitis (gardnerella): POSITIVE — AB
Candida Glabrata: NEGATIVE
Candida Vaginitis: NEGATIVE
Chlamydia: NEGATIVE
Comment: NEGATIVE
Comment: NEGATIVE
Comment: NEGATIVE
Comment: NEGATIVE
Comment: NEGATIVE
Comment: NORMAL
Neisseria Gonorrhea: NEGATIVE
Trichomonas: NEGATIVE

## 2021-09-18 ENCOUNTER — Telehealth (INDEPENDENT_AMBULATORY_CARE_PROVIDER_SITE_OTHER): Payer: Medicaid Other | Admitting: Obstetrics

## 2021-09-18 ENCOUNTER — Telehealth (HOSPITAL_COMMUNITY): Payer: Self-pay | Admitting: Emergency Medicine

## 2021-09-18 ENCOUNTER — Other Ambulatory Visit: Payer: Self-pay | Admitting: Obstetrics

## 2021-09-18 ENCOUNTER — Encounter: Payer: Self-pay | Admitting: Obstetrics

## 2021-09-18 DIAGNOSIS — Z30011 Encounter for initial prescription of contraceptive pills: Secondary | ICD-10-CM

## 2021-09-18 DIAGNOSIS — Z3009 Encounter for other general counseling and advice on contraception: Secondary | ICD-10-CM | POA: Diagnosis not present

## 2021-09-18 DIAGNOSIS — G43109 Migraine with aura, not intractable, without status migrainosus: Secondary | ICD-10-CM

## 2021-09-18 DIAGNOSIS — N939 Abnormal uterine and vaginal bleeding, unspecified: Secondary | ICD-10-CM | POA: Diagnosis not present

## 2021-09-18 MED ORDER — METRONIDAZOLE 0.75 % VA GEL: 1.0000 | Freq: Every day | VAGINAL | 0 refills | Status: AC

## 2021-09-18 MED ORDER — SLYND 4 MG PO TABS: 1.0000 | ORAL_TABLET | Freq: Every day | ORAL | 11 refills | Status: DC

## 2021-09-18 MED ORDER — IBUPROFEN 800 MG PO TABS
ORAL_TABLET | ORAL | 5 refills | Status: DC
Start: 2021-09-18 — End: 2022-06-25
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
## 2021-10-29 ENCOUNTER — Other Ambulatory Visit: Payer: Self-pay

## 2021-10-29 ENCOUNTER — Ambulatory Visit
Admission: EM | Admit: 2021-10-29 | Discharge: 2021-10-29 | Disposition: A | Discharge status: Home / Self Care | Payer: Medicaid Other | Attending: Internal Medicine | Admitting: Internal Medicine

## 2021-10-29 DIAGNOSIS — N898 Other specified noninflammatory disorders of vagina: Secondary | ICD-10-CM | POA: Diagnosis not present

## 2021-10-29 DIAGNOSIS — Z113 Encounter for screening for infections with a predominantly sexual mode of transmission: Secondary | ICD-10-CM | POA: Insufficient documentation

## 2021-10-29 DIAGNOSIS — R0789 Other chest pain: Secondary | ICD-10-CM | POA: Insufficient documentation

## 2021-10-29 NOTE — Discharge Instructions (Signed)
Please go to the emergency department as soon as you leave urgent care for further evaluation and management of chest pain.  Your vaginal swab is pending.  We will call if results are positive and treat as appropriate.
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
## 2021-10-29 NOTE — ED Triage Notes (Signed)
Pt reports midsternal tightness when inhaling yesterday.  Is improved today but still there.  No coughing, wheezing, nausea, dizziness. Worse with inspirations.   Pt also reports vaginal discharge and odor x 2 days.  Denies vaginal rash, urinary s/s, denies abdominal pain.  Has h/o BV.

## 2021-10-30 LAB — CERVICOVAGINAL ANCILLARY ONLY
Bacterial Vaginitis (gardnerella): POSITIVE — AB
Candida Glabrata: NEGATIVE
Candida Vaginitis: POSITIVE — AB
Chlamydia: NEGATIVE
Comment: NEGATIVE
Comment: NEGATIVE
Comment: NEGATIVE
Comment: NEGATIVE
Comment: NEGATIVE
Comment: NORMAL
Neisseria Gonorrhea: NEGATIVE
Trichomonas: NEGATIVE

## 2021-10-31 ENCOUNTER — Telehealth: Payer: Self-pay | Admitting: Emergency Medicine

## 2021-10-31 MED ORDER — METRONIDAZOLE 0.75 % VA GEL: 1.0000 | Freq: Every day | VAGINAL | 0 refills | Status: AC

## 2021-10-31 MED ORDER — TERCONAZOLE 0.4 % VA CREA: 1.0000 | TOPICAL_CREAM | Freq: Every day | VAGINAL | 0 refills | Status: AC

## 2022-01-20 ENCOUNTER — Ambulatory Visit
Admission: RE | Admit: 2022-01-20 | Discharge: 2022-01-20 | Disposition: A | Discharge status: Home / Self Care | Payer: Medicaid Other | Source: Ambulatory Visit | Attending: Physician Assistant | Admitting: Physician Assistant

## 2022-01-20 ENCOUNTER — Other Ambulatory Visit: Payer: Self-pay

## 2022-01-20 VITALS — BP 109/49 | HR 78 | Temp 98.0°F | Resp 18

## 2022-01-20 DIAGNOSIS — N898 Other specified noninflammatory disorders of vagina: Secondary | ICD-10-CM | POA: Diagnosis not present
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
## 2022-01-20 NOTE — ED Triage Notes (Signed)
Two day h/o vaginal discomfort, discharge and itching. Pt reports that she feels better today, but is concerned for BV. Denies vaginal odor and dysuria. No meds taken.

## 2022-01-21 ENCOUNTER — Telehealth (HOSPITAL_COMMUNITY): Payer: Self-pay | Admitting: Emergency Medicine

## 2022-01-21 LAB — CERVICOVAGINAL ANCILLARY ONLY
Bacterial Vaginitis (gardnerella): POSITIVE — AB
Chlamydia: NEGATIVE
Comment: NEGATIVE
Comment: NEGATIVE
Comment: NEGATIVE
Comment: NORMAL
Neisseria Gonorrhea: NEGATIVE
Trichomonas: NEGATIVE

## 2022-01-21 MED ORDER — METRONIDAZOLE 0.75 % VA GEL: 1.0000 | Freq: Every day | VAGINAL | 0 refills | Status: AC

## 2022-02-22 ENCOUNTER — Ambulatory Visit
Admission: EM | Admit: 2022-02-22 | Discharge: 2022-02-22 | Disposition: A | Discharge status: Home / Self Care | Payer: Medicaid Other | Attending: Internal Medicine | Admitting: Internal Medicine

## 2022-02-22 ENCOUNTER — Other Ambulatory Visit: Payer: Self-pay

## 2022-02-22 DIAGNOSIS — R0789 Other chest pain: Secondary | ICD-10-CM

## 2022-02-22 NOTE — ED Triage Notes (Signed)
Pt c/o sharp sternal pain the worsens with deep breathing. States "I have been having chest pains for a while." This episode began a few hours ago. Denies SOB or dyspnea at this time.
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
## 2022-02-22 NOTE — Discharge Instructions (Signed)
Please go to the emergency department as soon as you leave urgent care for further evaluation and management. ?

## 2022-05-08 ENCOUNTER — Encounter: Payer: Self-pay | Admitting: Emergency Medicine

## 2022-05-08 ENCOUNTER — Ambulatory Visit
Admission: EM | Admit: 2022-05-08 | Discharge: 2022-05-08 | Disposition: A | Discharge status: Home / Self Care | Payer: Medicaid Other | Attending: Emergency Medicine | Admitting: Emergency Medicine

## 2022-05-08 DIAGNOSIS — J029 Acute pharyngitis, unspecified: Secondary | ICD-10-CM | POA: Diagnosis present

## 2022-05-08 DIAGNOSIS — J069 Acute upper respiratory infection, unspecified: Secondary | ICD-10-CM | POA: Insufficient documentation

## 2022-05-08 LAB — POCT RAPID STREP A (OFFICE): Rapid Strep A Screen: NEGATIVE

## 2022-05-08 MED ORDER — CETIRIZINE HCL 10 MG PO TABS: 10.0000 mg | ORAL_TABLET | Freq: Every day | ORAL | 2 refills | Status: DC

## 2022-05-08 NOTE — ED Notes (Signed)
Called patient states she was at [employer] getting something for her sore throat, will return in 2 minutes.  Patient still unavailable. ?

## 2022-05-08 NOTE — Discharge Instructions (Signed)
You can try the daily allergy medicine for congestion and ear pressure. ? ?Please return to the urgent care or emergency department if symptoms worsen or do not improve. ?
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
## 2022-05-08 NOTE — ED Triage Notes (Signed)
Patient c/o sore throat x 4 days, spitting up blood, fever last night of 101.  Patient has taken Cepacol throat lozenges, taken Aleve on Monday.  ?

## 2022-05-11 LAB — CULTURE, GROUP A STREP (THRC)

## 2022-06-19 ENCOUNTER — Ambulatory Visit
Admission: EM | Admit: 2022-06-19 | Discharge: 2022-06-19 | Disposition: A | Discharge status: Home / Self Care | Payer: Medicaid Other | Attending: Internal Medicine | Admitting: Internal Medicine

## 2022-06-19 DIAGNOSIS — J020 Streptococcal pharyngitis: Secondary | ICD-10-CM

## 2022-06-19 LAB — POCT RAPID STREP A (OFFICE): Rapid Strep A Screen: POSITIVE — AB

## 2022-06-19 MED ORDER — AMOXICILLIN 500 MG PO CAPS: 500.0000 mg | ORAL_CAPSULE | Freq: Two times a day (BID) | ORAL | 0 refills | Status: AC
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
## 2022-06-19 NOTE — Discharge Instructions (Signed)
You have strep throat which is being treated with an antibiotic.  Please follow-up if symptoms persist or worsen. ?

## 2022-06-19 NOTE — ED Triage Notes (Signed)
Pt present sore throat with difficulty swallowing, symptoms started

## 2022-06-25 ENCOUNTER — Emergency Department (HOSPITAL_COMMUNITY): Payer: Commercial Managed Care - HMO

## 2022-06-25 ENCOUNTER — Emergency Department (HOSPITAL_COMMUNITY)
Admission: EM | Admit: 2022-06-25 | Discharge: 2022-06-25 | Disposition: A | Discharge status: Home / Self Care | Payer: Commercial Managed Care - HMO | Attending: Emergency Medicine | Admitting: Emergency Medicine

## 2022-06-25 ENCOUNTER — Encounter (HOSPITAL_COMMUNITY): Payer: Self-pay | Admitting: Emergency Medicine

## 2022-06-25 DIAGNOSIS — J40 Bronchitis, not specified as acute or chronic: Secondary | ICD-10-CM | POA: Diagnosis not present

## 2022-06-25 DIAGNOSIS — Z20822 Contact with and (suspected) exposure to covid-19: Secondary | ICD-10-CM | POA: Insufficient documentation

## 2022-06-25 DIAGNOSIS — Z91040 Latex allergy status: Secondary | ICD-10-CM | POA: Diagnosis not present

## 2022-06-25 DIAGNOSIS — Z7722 Contact with and (suspected) exposure to environmental tobacco smoke (acute) (chronic): Secondary | ICD-10-CM | POA: Insufficient documentation

## 2022-06-25 DIAGNOSIS — Z87891 Personal history of nicotine dependence: Secondary | ICD-10-CM | POA: Diagnosis not present

## 2022-06-25 DIAGNOSIS — R0602 Shortness of breath: Secondary | ICD-10-CM | POA: Diagnosis present

## 2022-06-25 LAB — CBC
HCT: 42.6 % (ref 36.0–46.0)
Hemoglobin: 13.9 g/dL (ref 12.0–15.0)
MCH: 28.4 pg (ref 26.0–34.0)
MCHC: 32.6 g/dL (ref 30.0–36.0)
MCV: 87.1 fL (ref 80.0–100.0)
Platelets: 363 10*3/uL (ref 150–400)
RBC: 4.89 MIL/uL (ref 3.87–5.11)
RDW: 12.5 % (ref 11.5–15.5)
WBC: 10.2 10*3/uL (ref 4.0–10.5)
nRBC: 0 % (ref 0.0–0.2)

## 2022-06-25 LAB — TROPONIN I (HIGH SENSITIVITY): Troponin I (High Sensitivity): 3 ng/L (ref <18)

## 2022-06-25 LAB — BASIC METABOLIC PANEL
Anion gap: 10 (ref 5–15)
BUN: 7 mg/dL (ref 6–20)
CO2: 21 mmol/L — ABNORMAL LOW (ref 22–32)
Calcium: 9.6 mg/dL (ref 8.9–10.3)
Chloride: 105 mmol/L (ref 98–111)
Creatinine, Ser: 0.89 mg/dL (ref 0.44–1.00)
GFR, Estimated: >60 mL/min (ref >60)
Glucose, Bld: 107 mg/dL — ABNORMAL HIGH (ref 70–99)
Potassium: 4.1 mmol/L (ref 3.5–5.1)
Sodium: 136 mmol/L (ref 135–145)

## 2022-06-25 LAB — I-STAT BETA HCG BLOOD, ED (MC, WL, AP ONLY): I-stat hCG, quantitative: <5 m[IU]/mL (ref <5)

## 2022-06-25 LAB — RESP PANEL BY RT-PCR (FLU A&B, COVID) ARPGX2
Influenza A by PCR: NEGATIVE
Influenza B by PCR: NEGATIVE
SARS Coronavirus 2 by RT PCR: NEGATIVE

## 2022-06-25 MED ORDER — PREDNISONE 10 MG PO TABS: 40.0000 mg | ORAL_TABLET | Freq: Every day | ORAL | 0 refills | Status: AC

## 2022-06-25 MED ORDER — ALBUTEROL SULFATE HFA 108 (90 BASE) MCG/ACT IN AERS: 1.0000 | INHALATION_SPRAY | Freq: Four times a day (QID) | RESPIRATORY_TRACT | 0 refills | Status: DC | PRN

## 2022-06-25 MED ORDER — PREDNISONE 20 MG PO TABS
60.0000 mg | ORAL_TABLET | Freq: Once | ORAL | Status: AC
  Administered 2022-06-25: 60 mg via ORAL
  Filled 2022-06-25: qty 3

## 2022-06-25 MED ORDER — IPRATROPIUM-ALBUTEROL 0.5-2.5 (3) MG/3ML IN SOLN
3.0000 mL | Freq: Once | RESPIRATORY_TRACT | Status: AC
  Administered 2022-06-25: 3 mL via RESPIRATORY_TRACT
  Filled 2022-06-25: qty 3

## 2022-08-12 ENCOUNTER — Ambulatory Visit
Admission: EM | Admit: 2022-08-12 | Discharge: 2022-08-12 | Disposition: A | Discharge status: Home / Self Care | Payer: Medicaid Other | Attending: Physician Assistant | Admitting: Physician Assistant

## 2022-08-12 DIAGNOSIS — N760 Acute vaginitis: Secondary | ICD-10-CM | POA: Insufficient documentation

## 2022-08-12 MED ORDER — METRONIDAZOLE 0.75 % VA GEL
1.0000 | Freq: Two times a day (BID) | VAGINAL | 0 refills | Status: DC
Start: 2022-08-12 — End: 2024-09-13

## 2022-08-12 NOTE — ED Triage Notes (Signed)
Pt presents to uc with co of vaginal discomfort. Pt reports concern for bv has had bv in past and states this feels the same. Pt is currently breast feeding and is requesting the topical tx.
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
## 2022-08-13 LAB — CERVICOVAGINAL ANCILLARY ONLY
Bacterial Vaginitis (gardnerella): POSITIVE — AB
Candida Glabrata: NEGATIVE
Candida Vaginitis: NEGATIVE
Chlamydia: NEGATIVE
Comment: NEGATIVE
Comment: NEGATIVE
Comment: NEGATIVE
Comment: NEGATIVE
Comment: NEGATIVE
Comment: NORMAL
Neisseria Gonorrhea: NEGATIVE
Trichomonas: NEGATIVE

## 2022-12-07 ENCOUNTER — Ambulatory Visit
Admission: EM | Admit: 2022-12-07 | Discharge: 2022-12-07 | Disposition: A | Discharge status: Home / Self Care | Payer: Medicaid Other | Attending: Physician Assistant | Admitting: Physician Assistant

## 2022-12-07 DIAGNOSIS — N3001 Acute cystitis with hematuria: Secondary | ICD-10-CM | POA: Diagnosis present

## 2022-12-07 LAB — POCT URINALYSIS DIP (MANUAL ENTRY)
Glucose, UA: NEGATIVE mg/dL
Nitrite, UA: POSITIVE — AB
Protein Ur, POC: >=300 mg/dL — AB
Spec Grav, UA: 1.025 (ref 1.010–1.025)
Urobilinogen, UA: 2 E.U./dL — AB
pH, UA: 6 (ref 5.0–8.0)

## 2022-12-07 LAB — POCT URINE PREGNANCY: Preg Test, Ur: NEGATIVE

## 2022-12-07 MED ORDER — NITROFURANTOIN MONOHYD MACRO 100 MG PO CAPS: 100.0000 mg | ORAL_CAPSULE | Freq: Two times a day (BID) | ORAL | 0 refills | Status: DC
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
## 2022-12-07 NOTE — ED Triage Notes (Signed)
Pt presents with pelvic pain, urinary frequency, and strong odor since yesterday.

## 2022-12-08 LAB — COMPREHENSIVE METABOLIC PANEL
ALT: 12 IU/L (ref 0–32)
AST: 17 IU/L (ref 0–40)
Albumin/Globulin Ratio: 1.7 (ref 1.2–2.2)
Albumin: 4.5 g/dL (ref 4.0–5.0)
Alkaline Phosphatase: 75 IU/L (ref 44–121)
BUN/Creatinine Ratio: 13 (ref 9–23)
BUN: 10 mg/dL (ref 6–20)
Bilirubin Total: 0.4 mg/dL (ref 0.0–1.2)
CO2: 20 mmol/L (ref 20–29)
Calcium: 9.5 mg/dL (ref 8.7–10.2)
Chloride: 106 mmol/L (ref 96–106)
Creatinine, Ser: 0.75 mg/dL (ref 0.57–1.00)
Globulin, Total: 2.7 g/dL (ref 1.5–4.5)
Glucose: 76 mg/dL (ref 70–99)
Potassium: 4.1 mmol/L (ref 3.5–5.2)
Sodium: 140 mmol/L (ref 134–144)
Total Protein: 7.2 g/dL (ref 6.0–8.5)
eGFR: 114 mL/min/{1.73_m2} (ref >59)

## 2022-12-08 LAB — CBC WITH DIFFERENTIAL/PLATELET
Basophils Absolute: 0.1 10*3/uL (ref 0.0–0.2)
Basos: 1 %
EOS (ABSOLUTE): 0 10*3/uL (ref 0.0–0.4)
Eos: 0 %
Hematocrit: 36.8 % (ref 34.0–46.6)
Hemoglobin: 12 g/dL (ref 11.1–15.9)
Immature Grans (Abs): 0 10*3/uL (ref 0.0–0.1)
Immature Granulocytes: 0 %
Lymphocytes Absolute: 4.1 10*3/uL — ABNORMAL HIGH (ref 0.7–3.1)
Lymphs: 35 %
MCH: 27.8 pg (ref 26.6–33.0)
MCHC: 32.6 g/dL (ref 31.5–35.7)
MCV: 85 fL (ref 79–97)
Monocytes Absolute: 0.6 10*3/uL (ref 0.1–0.9)
Monocytes: 5 %
Neutrophils Absolute: 6.9 10*3/uL (ref 1.4–7.0)
Neutrophils: 59 %
Platelets: 298 10*3/uL (ref 150–450)
RBC: 4.31 x10E6/uL (ref 3.77–5.28)
RDW: 13.4 % (ref 11.7–15.4)
WBC: 11.7 10*3/uL — ABNORMAL HIGH (ref 3.4–10.8)

## 2022-12-08 LAB — URINE CULTURE

## 2023-03-21 ENCOUNTER — Encounter: Payer: Self-pay | Admitting: Emergency Medicine

## 2023-03-21 ENCOUNTER — Ambulatory Visit
Admission: EM | Admit: 2023-03-21 | Discharge: 2023-03-21 | Disposition: A | Discharge status: Home / Self Care | Payer: Medicaid Other | Attending: Physician Assistant | Admitting: Physician Assistant

## 2023-03-21 ENCOUNTER — Ambulatory Visit (INDEPENDENT_AMBULATORY_CARE_PROVIDER_SITE_OTHER): Payer: Medicaid Other

## 2023-03-21 DIAGNOSIS — M79674 Pain in right toe(s): Secondary | ICD-10-CM | POA: Diagnosis not present

## 2023-03-21 DIAGNOSIS — S91209A Unspecified open wound of unspecified toe(s) with damage to nail, initial encounter: Secondary | ICD-10-CM

## 2023-03-21 DIAGNOSIS — S90211A Contusion of right great toe with damage to nail, initial encounter: Secondary | ICD-10-CM

## 2023-03-21 MED ORDER — CEPHALEXIN 500 MG PO CAPS: 500.0000 mg | ORAL_CAPSULE | Freq: Three times a day (TID) | ORAL | 0 refills | Status: DC

## 2023-03-21 MED ORDER — IBUPROFEN 600 MG PO TABS: 600.0000 mg | ORAL_TABLET | Freq: Four times a day (QID) | ORAL | 0 refills | Status: DC | PRN

## 2023-03-21 NOTE — ED Triage Notes (Signed)
Pt presents with right big toe pain. States toe nail came completely off last night.

## 2023-03-21 NOTE — Discharge Instructions (Addendum)
Advised take ibuprofen 600 mg every 6 hours with food on a regular basis to help decrease toe pain and swelling. Advised take Keflex 500 mg 3 times a day until completed to help protect against infection. Advised to use Epsom salt soaks, 1 tablespoon lukewarm water, soak toe for 10 minutes, 3-4 times a day over the next couple days to help reduce pain and swelling.  Advised follow-up PCP return to urgent care as needed.
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
## 2024-02-11 ENCOUNTER — Emergency Department (HOSPITAL_COMMUNITY)
Admission: EM | Admit: 2024-02-11 | Discharge: 2024-02-11 | Disposition: A | Discharge status: Home / Self Care | Payer: Medicaid Other | Attending: Emergency Medicine | Admitting: Emergency Medicine

## 2024-02-11 ENCOUNTER — Emergency Department (HOSPITAL_COMMUNITY): Payer: Medicaid Other

## 2024-02-11 ENCOUNTER — Encounter (HOSPITAL_COMMUNITY): Payer: Self-pay | Admitting: Emergency Medicine

## 2024-02-11 ENCOUNTER — Other Ambulatory Visit: Payer: Self-pay

## 2024-02-11 DIAGNOSIS — R55 Syncope and collapse: Secondary | ICD-10-CM | POA: Diagnosis present

## 2024-02-11 DIAGNOSIS — W01198A Fall on same level from slipping, tripping and stumbling with subsequent striking against other object, initial encounter: Secondary | ICD-10-CM | POA: Insufficient documentation

## 2024-02-11 DIAGNOSIS — S0990XA Unspecified injury of head, initial encounter: Secondary | ICD-10-CM | POA: Insufficient documentation

## 2024-02-11 DIAGNOSIS — R7309 Other abnormal glucose: Secondary | ICD-10-CM | POA: Diagnosis not present

## 2024-02-11 LAB — CBC
HCT: 40.7 % (ref 36.0–46.0)
Hemoglobin: 13.5 g/dL (ref 12.0–15.0)
MCH: 28.7 pg (ref 26.0–34.0)
MCHC: 33.2 g/dL (ref 30.0–36.0)
MCV: 86.4 fL (ref 80.0–100.0)
Platelets: 298 10*3/uL (ref 150–400)
RBC: 4.71 MIL/uL (ref 3.87–5.11)
RDW: 12.2 % (ref 11.5–15.5)
WBC: 7.9 10*3/uL (ref 4.0–10.5)
nRBC: 0 % (ref 0.0–0.2)

## 2024-02-11 LAB — BASIC METABOLIC PANEL
Anion gap: 8 (ref 5–15)
BUN: 7 mg/dL (ref 6–20)
CO2: 23 mmol/L (ref 22–32)
Calcium: 9.3 mg/dL (ref 8.9–10.3)
Chloride: 106 mmol/L (ref 98–111)
Creatinine, Ser: 0.75 mg/dL (ref 0.44–1.00)
GFR, Estimated: >60 mL/min (ref >60)
Glucose, Bld: 95 mg/dL (ref 70–99)
Potassium: 4.6 mmol/L (ref 3.5–5.1)
Sodium: 137 mmol/L (ref 135–145)

## 2024-02-11 LAB — CBG MONITORING, ED: Glucose-Capillary: 94 mg/dL (ref 70–99)

## 2024-02-11 LAB — HCG, SERUM, QUALITATIVE: Preg, Serum: NEGATIVE

## 2024-02-11 MED ORDER — ACETAMINOPHEN 500 MG PO TABS: 1000.0000 mg | ORAL_TABLET | Freq: Once | ORAL | Status: DC

## 2024-02-11 MED ORDER — ONDANSETRON HCL 4 MG PO TABS: 4.0000 mg | ORAL_TABLET | Freq: Four times a day (QID) | ORAL | 0 refills | Status: DC

## 2024-02-11 NOTE — Discharge Instructions (Signed)
You are seen emergency department today after head injury.  I think you likely sustained a concussion. Most of these resolve within 3 weeks but symptoms can last longer than this. Take tylenol as needed as first line medication for headache.  Mental and physical rest are important. After a short period, light cardio (stationary bike, walking, light jogging) may be beneficial as long as it does not worsen your symptoms. Do not engage in activities that put you at risk of getting struck in the head.  Please use acetaminophen (Tylenol) or ibuprofen (Advil, Motrin) for pain.  You may use 800 mg ibuprofen every 6 hours or 1000 mg of acetaminophen every 6 hours.  You may choose to alternate between the two, this would be most effective. Do not exceed 4000 mg of acetaminophen within 24 hours.  Do not exceed 3200 mg ibuprofen within 24 hours.  Please follow up with your PCP within the next 1-2 weeks. If you develop persistent vomiting, weakness or numbness in arms/legs, loss of vision, worsening confusion (all of these are unusual in concussion), return to the ER. If after about 1-2 weeks your symptoms have not improved, you may benefit from seeing a concussion specialist. Their phone number is: [phone]

## 2024-02-11 NOTE — ED Provider Notes (Signed)
[facility] Provider Note   CSN: [id number] Arrival date & time: 02/11/24  1127     History  Chief Complaint  Patient presents with   Loss of Consciousness   Neck Pain    Kristi Roth is a 28 y.o. female who presents to the emergency department after head trauma and syncopal episode. Pt states she was playing with her son and walking backwards when she tripped, striking her head on the wall. Felt "woozy" but did not pass out. She was able to get up, walk across her house, and walk to her neighbor's house to get them to call 911. When she walked over to her neighbor's house she continued to feel lightheaded and thinks she passed out, striking her head again. She is complaining of a headache and pain in her buttocks from when she fell. Reports prior syncopal episodes in the past. No hx of cardiac problems. No family hx of sudden cardiac death. Denied having any preceding CP prior to syncopal episode.    Loss of Consciousness Associated symptoms: headaches   Neck Pain Associated symptoms: headaches and syncope        Home Medications Prior to Admission medications   Medication Sig Start Date End Date Taking? Authorizing Provider  ondansetron (ZOFRAN) 4 MG tablet Take 1 tablet (4 mg total) by mouth every 6 (six) hours. 02/11/24  Yes Janeen Watson T, PA-C  acetaminophen (TYLENOL) 500 MG tablet Take 500-1,000 mg by mouth See admin instructions. Take [phone] mg by mouth 2-3 times a day as needed for chest pain    [provider]  albuterol (VENTOLIN HFA) 108 (90 Base) MCG/ACT inhaler Inhale 1-2 puffs into the lungs every 6 (six) hours as needed for wheezing or shortness of breath. 06/25/22   Army Melia A, PA-C  Dextromethorphan-GG-APAP (CORICIDIN HBP COLD/COUGH/FLU PO) Take 2 tablets by mouth See admin instructions. Take 2 tablets by mouth 2-3 times daily as needed for cough /cold/congestion    [provider]  ibuprofen  (ADVIL) 600 MG tablet Take 1 tablet (600 mg total) by mouth every 6 (six) hours as needed. 03/21/23   Ellsworth Lennox, PA-C  metroNIDAZOLE (METROGEL VAGINAL) 0.75 % vaginal gel Place 1 Applicatorful vaginally 2 (two) times daily. 08/12/22   Tomi Bamberger, PA-C  Multiple Vitamin (MULTIVITAMIN PO) Take 1 tablet by mouth daily.    [provider]      Allergies    Latex    Review of Systems   Review of Systems  Cardiovascular:  Positive for syncope.  Musculoskeletal:  Positive for neck pain.  Neurological:  Positive for syncope and headaches.  All other systems reviewed and are negative.   Physical Exam Updated Vital Signs BP (!) 96/52   Pulse 78   Temp 98.8 F (37.1 C) (Oral)   Resp 16   Ht 5\' 7"  (1.702 m)   Wt 86.2 kg   LMP 02/02/2024 (Approximate)   SpO2 100%   Breastfeeding No   BMI 29.76 kg/m  Physical Exam Vitals and nursing note reviewed.  Constitutional:      Appearance: Normal appearance.  HENT:     Head: Normocephalic.     Comments: Tenderness at the base of the skull, no deformity Eyes:     Conjunctiva/sclera: Conjunctivae normal.  Neck:     Comments: No midline cervical spine tenderness, step offs or crepitus. Bilateral paraspinal muscular tenderness.  Cardiovascular:     Rate and Rhythm: Normal rate and  regular rhythm.  Pulmonary:     Effort: Pulmonary effort is normal. No respiratory distress.     Breath sounds: Normal breath sounds.  Abdominal:     General: There is no distension.     Palpations: Abdomen is soft.     Tenderness: There is no abdominal tenderness.  Skin:    General: Skin is warm and dry.  Neurological:     General: No focal deficit present.     Mental Status: She is alert.     ED Results / Procedures / Treatments   Labs (all labs ordered are listed, but only abnormal results are displayed) Labs Reviewed  BASIC METABOLIC PANEL  CBC  HCG, SERUM, QUALITATIVE  CBG MONITORING, ED    EKG None  Radiology No results  found.  Procedures Procedures    Medications Ordered in ED Medications  acetaminophen (TYLENOL) tablet 1,000 mg (1,000 mg Oral Not Given 02/11/24 1425)    ED Course/ Medical Decision Making/ A&P                                 Medical Decision Making Amount and/or Complexity of Data Reviewed Labs: ordered. Radiology: ordered.  This patient is a 28 y.o. female  who presents to the ED for concern of head injury, syncope. Not on blood thinners.   Differential diagnoses prior to evaluation: The emergent differential diagnosis includes, but is not limited to,  CVA, ACS, arrhythmia, vasovagal / orthostatic hypotension, sepsis, hypoglycemia, electrolyte disturbance, respiratory failure, anemia, dehydration, heat injury, polypharmacy. This is not an exhaustive differential.   Past Medical History / Co-morbidities / Social History: No reported PMH  Physical Exam: Physical exam performed. The pertinent findings include: Normal vitals, no acute distress. Heart regular rate and rhythm, lung sounds clear. No midline cervical spinal tenderness, step offs or crepitus. Tenderness to palpation at occiput, no deformity.   Lab Tests/Imaging studies: I personally interpreted labs/imaging and the pertinent results include:  CBC, BMP normal. Pregnancy negative.  CT head and cervical spine pending at time of shift change. Pt wants to be called with her results. On my review, no obvious fracture or intracranial bleed.  Cardiac monitoring: EKG obtained and interpreted by myself and attending physician which shows: NSR   Medications: I ordered medication including tylenol.  I have reviewed the patients home medicines and have made adjustments as needed.   Disposition: After consideration of the diagnostic results and the patients response to treatment, I feel that emergency department workup does not suggest an emergent condition requiring admission or immediate intervention beyond what has been  performed at this time. The plan is: discharge to home with concussion precautions. Oncoming PA at shift change will call patient with her results. The patient is safe for discharge and has been instructed to return immediately for worsening symptoms, change in symptoms or any other concerns.  Final Clinical Impression(s) / ED Diagnoses Final diagnoses:  Injury of head, initial encounter  Syncope, unspecified syncope type    Rx / DC Orders ED Discharge Orders          Ordered    ondansetron (ZOFRAN) 4 MG tablet  Every 6 hours        02/11/24 1510           Portions of this report may have been transcribed using voice recognition software. Every effort was made to ensure accuracy; however, inadvertent computerized transcription errors may be present.  Jeanella Flattery 02/11/24 1511    Gerhard Munch, MD 02/11/24 [phone]
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
## 2024-02-11 NOTE — ED Notes (Signed)
Awaiting patient from lobby.

## 2024-02-11 NOTE — ED Notes (Signed)
Patient transported to CT

## 2024-02-11 NOTE — ED Notes (Signed)
C-collar in place
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
## 2024-05-11 ENCOUNTER — Other Ambulatory Visit: Payer: Self-pay

## 2024-05-11 ENCOUNTER — Encounter: Payer: Self-pay | Admitting: Emergency Medicine

## 2024-05-11 ENCOUNTER — Ambulatory Visit
Admission: EM | Admit: 2024-05-11 | Discharge: 2024-05-11 | Disposition: A | Discharge status: Home / Self Care | Attending: Family Medicine | Admitting: Family Medicine

## 2024-05-11 ENCOUNTER — Ambulatory Visit (INDEPENDENT_AMBULATORY_CARE_PROVIDER_SITE_OTHER)

## 2024-05-11 DIAGNOSIS — M542 Cervicalgia: Secondary | ICD-10-CM | POA: Diagnosis not present

## 2024-05-11 DIAGNOSIS — R519 Headache, unspecified: Secondary | ICD-10-CM | POA: Diagnosis not present

## 2024-05-11 MED ORDER — TIZANIDINE HCL 4 MG PO TABS: 4.0000 mg | ORAL_TABLET | Freq: Three times a day (TID) | ORAL | 0 refills | Status: DC | PRN
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
## 2024-06-17 ENCOUNTER — Ambulatory Visit
Admission: EM | Admit: 2024-06-17 | Discharge: 2024-06-17 | Disposition: A | Discharge status: Home / Self Care | Attending: Physician Assistant | Admitting: Physician Assistant

## 2024-06-17 ENCOUNTER — Encounter: Payer: Self-pay | Admitting: *Deleted

## 2024-06-17 ENCOUNTER — Other Ambulatory Visit: Payer: Self-pay

## 2024-06-17 DIAGNOSIS — J029 Acute pharyngitis, unspecified: Secondary | ICD-10-CM | POA: Diagnosis not present

## 2024-06-17 LAB — POC SARS CORONAVIRUS 2 AG -  ED: SARS Coronavirus 2 Ag: NEGATIVE

## 2024-06-17 LAB — POCT RAPID STREP A (OFFICE): Rapid Strep A Screen: NEGATIVE
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
## 2024-06-17 NOTE — ED Triage Notes (Signed)
 Pt reports sore throat x 1 day. States has been around children that have had similar symptoms

## 2024-06-18 LAB — CULTURE, GROUP A STREP (THRC)

## 2024-06-21 ENCOUNTER — Ambulatory Visit (HOSPITAL_COMMUNITY): Payer: Self-pay

## 2024-06-24 ENCOUNTER — Telehealth: Payer: Self-pay | Admitting: Emergency Medicine

## 2024-06-24 NOTE — Telephone Encounter (Signed)
 Tried to call patient at provider request to follow up on symptom improvement. No answer, forwarded to generic voicemail

## 2024-09-13 ENCOUNTER — Encounter: Payer: Self-pay | Admitting: Emergency Medicine

## 2024-09-13 ENCOUNTER — Ambulatory Visit
Admission: EM | Admit: 2024-09-13 | Discharge: 2024-09-13 | Disposition: A | Discharge status: Home / Self Care | Attending: Nurse Practitioner | Admitting: Nurse Practitioner

## 2024-09-13 DIAGNOSIS — R519 Headache, unspecified: Secondary | ICD-10-CM

## 2024-09-13 MED ORDER — KETOROLAC TROMETHAMINE 30 MG/ML IJ SOLN
60.0000 mg | Freq: Once | INTRAMUSCULAR | Status: DC
  Administered 2024-09-13: 30 mg via INTRAMUSCULAR

## 2024-09-13 MED ORDER — KETOROLAC TROMETHAMINE 30 MG/ML IJ SOLN: 30.0000 mg | Freq: Once | INTRAMUSCULAR | Status: DC

## 2024-09-13 MED ORDER — IBUPROFEN 800 MG PO TABS: 800.0000 mg | ORAL_TABLET | Freq: Three times a day (TID) | ORAL | 0 refills | Status: AC | PRN

## 2024-09-13 MED ORDER — ONDANSETRON 8 MG PO TBDP: 8.0000 mg | ORAL_TABLET | Freq: Three times a day (TID) | ORAL | 0 refills | Status: AC | PRN

## 2024-09-13 NOTE — ED Triage Notes (Signed)
 Pt presents c/o headaches x 4 days. Pt reports the headaches have been light sensitive. Pt reports when she leans over or down the pain increases. Pt denies any additional sxs.

## 2024-09-13 NOTE — Discharge Instructions (Addendum)
 You were seen today for a headache and received an injection to help with pain. You were also prescribed medications to take at home as needed. Try to avoid things that can make your symptoms worse, such as bright lights or loud noises. Rest in a dark, quiet room during a migraine, and do not drive while experiencing symptoms.  To help prevent future headaches, avoid alcohol, tobacco, and nicotine products. Aim for 7 to 9 hours of sleep each night and work on managing stress through activities like deep breathing, meditation, or yoga. Staying hydrated and getting regular exercise can also help reduce the frequency of headaches.   Go to the emergency department if your migraine becomes severe or lasts more than 72 hours, or if you develop a fever, stiff neck, vision changes, muscle weakness, difficulty walking, fainting, or a seizure.
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
## 2024-09-14 ENCOUNTER — Ambulatory Visit: Payer: Self-pay

## 2024-12-26 ENCOUNTER — Emergency Department (HOSPITAL_COMMUNITY)
Admission: EM | Admit: 2024-12-26 | Discharge: 2024-12-27 | Discharge status: Against Medical Advice | Attending: Emergency Medicine | Admitting: Emergency Medicine

## 2024-12-26 DIAGNOSIS — Z5321 Procedure and treatment not carried out due to patient leaving prior to being seen by health care provider: Secondary | ICD-10-CM | POA: Insufficient documentation

## 2024-12-26 DIAGNOSIS — R079 Chest pain, unspecified: Secondary | ICD-10-CM | POA: Diagnosis present

## 2024-12-26 NOTE — ED Notes (Signed)
Called for vitals no response

## 2024-12-26 NOTE — ED Triage Notes (Signed)
 PT ambulatory to triage accompanied by 2 young children. Pt has complaints of chest pain that began yesterday afternoon. PT states that her chest hurts more with taking and breathing. Pt denies sore throat.
